# Patient Record
Sex: Female | Born: 1950 | Race: White | Hispanic: No | Smoking: Never smoker
Health system: Southern US, Academic
[De-identification: ages and names within clinical notes are randomized; demographics above are authoritative.]

## PROBLEM LIST (undated history)

## (undated) NOTE — Progress Notes (Signed)
Formatting of this note is different from the original.  Images from the original note were not included.  May 19, 2014    RE: Jamie Chambers    DOB: 07/21/1951  [facility] MEDICAL RECORD [id number]  Referring MD:  Dr. Carolann Littler Shrader  [address]                  [place] [address]    HPI:  Jamie Chambers is a 75 y.o. female with a PMHx of HTN, GERD, OSA and fibromyalgia who was seen today in the Neurosurgery clinic at the [facility] with Dr. Bluford Main. Shalini Stinebaugh was referred by Dr. Micheline Chapman for evaluation of L-sided neck pain and low back. She states that she has had many years of intermittent and progressive neck pain without h/o specific injury. She feels the neck pain has significantly worsened over the last 2 years. She complains of L-sided neck pain that radiates up into the occiput, down the L side of the neck, across the trapezius and over to the L shoulder. She has no radicular pain past the shoulder. She denies numbness but does feel as though she has intermittent weakness, at least of the hands, because she feels she more frequently "drops things" with both hands. Her pain is minimal with sitting, but as soon as she begins moving her neck the pain increases and she notes crepitus. She reports dull, occipital and posterior L-sided headaches. Additionally she complains of low back pain which is worst in the am when she first gets out of bed. The pain radiates to the posterior R hip but not down the leg. She complains of "numbness" circumferentially down the L leg from mid-thigh to the foot. She denies any bowel/bladder symptoms. She has been using Motrin OTC on average of 4 pills/day and recently completed ~6 weeks of PT for her neck which she felt helped while she was doing it, but obtained no lasting relief. She has not tried injections or oral steroids. She had a cervical MRI done in June which demonstrated diffuse multilevel mild degenerative changes with no  significant malalignment with several small disc bulges that cause some flattening of the ventral cord, but no significant central or foraminal stenosis. Additionally she brought with her an MRI (on CD) of her lumbar spine which is > 55 year old, but demonstrated no significant central or foraminal stenosis.    PAST MEDICAL HISTORY:  Past Medical History   Diagnosis Date   ? Hypertension    ? Heartburn    ? Unspecified sleep apnea    ? Fibromyalgia      Past Surgical History   Procedure Laterality Date   ? Appendectomy     ? Cholecystectomy     ? Hysterectomy     ? Tonsillectomy       Current Outpatient Prescriptions   Medication Sig   ? amlodipine (NORVASC) 5 MG tablet Take 5 mg by mouth daily.   ? aspirin 325 MG EC tablet Take 325 mg by mouth daily.   ? docusate sodium (COLACE) 100 MG capsule Take 100 mg by mouth 2 times daily.   ? Esomeprazole Magnesium (NEXIUM) 10 MG packet Take 10 mg by mouth every morning (before breakfast).   ? losartan (COZAAR) 50 MG tablet Take 50 mg by mouth daily.   ? metoprolol (TOPROL-XL) 50 MG 24 hr tablet Take 50 mg by mouth daily.     No current facility-administered medications for  this visit.     Allergies   Allergen Reactions   ? Ace Inhibitors Swelling     Family History   Problem Relation Age of Onset   ? Cancer Father    ? Heart Disease Father      History     Social History   ? Marital Status: Married     Spouse Name: N/A     Number of Children: N/A   ? Years of Education: N/A     Occupational History   ? Not on file.     Social History Main Topics   ? Smoking status: Never Smoker    ? Smokeless tobacco: Not on file   ? Alcohol Use: No   ? Drug Use: No   ? Sexual Activity: Not on file     Other Topics Concern   ? Not on file     Social History Narrative     REVIEW OF SYSTEMS:  No chest pain, SOB.  Denies headache, nausea,vomiting, fever, chills, vision changes, dizziness, balance problems, seizure. All other systems are negative except as noted in the HPI.    PHYSICAL EXAM:      Ht 1.524 m (5')  Wt 70.308 kg (155 lb)  BMI 30.27 kg/m2    Ms. [name] is a well developed, well nourished female in no acute distress. She is alert and cooperative. Her cervical spine is somewhat tender to palpation from the insertion of the L trapezius at the occiput, along the L side of the neck and across the top of the L shoulder. The lower back is slightly tender to palpation at the L SI joint. There is no evidence of paraspinous muscle spasm. Cervical spine ROM is slightly decreased in forward flexion and L-sided rotation with reported pain. The lumbar spine ROM is slightly decreased in all directions, most significantly in extension but not with rotation. ROM at the bilateral hips is full without significant pain. Her extremities are nontender and without clubbing, cyanosis, or edema. Muscle bulk and tone are normal. Straight leg raising is negative in the seated position. Her upper and lower extremity strength testing is 5/5 for deltoids, triceps, biceps, wrist flexion/extension, and hand intrinsics, as well as hip flexion, adduction and abduction and knee flexion, knee extension, foot dorsiflexion, foot plantar flexion, and EHL. Sensation is intact to light touch throughout. Deep tendon reflexes are normoactive and symmetric. There is no evidence of pathologic reflexes. Her gait is normal. She is able to tandem walk and to walk on her heels and toes with minimal difficulty.    IMAGING:  The following imaging studies were personally reviewed by myself and Dr. Tomma Lightning during today's clinic visit:  MRI of cervical spine from [facility] in [place], [place] (brought on CD, but not in [facility] PACS) dated 02/25/2014 demonstrates satisfactory alignment with small disc bulges, most prominent at C3-4, C5-6 and C6-7 but without significant cord compression or L-sided foraminal stenosis at any level. At C5-6 and C6-7 there is some uncovertebral hypertrophy causing mild to moderate foraminal stenosis on the RIGHT  (asymptomatic side). There is no evidence of cord signal change at any level.  C5-6  MRI of the lumbar spine from [facility] (on CD, but not in [facility] PACS) dated 12/19/2012 demonstrates satisfactory alignment without evidence of significant vertebral body or disc disease and no central or foraminal stenosis at any level.    ASSESSMENT:  Ms. Hackett is a pleasant 11 y.o. female with a long history of intermittent and progressive neck pain which has significantly worsened over the last 2 years without any history of injury. The pain seems myofascial ant isolated to the L trapezius. Additionally, she complains of low back pain without radicular pain, numbness or weakness. Her exam, likewise is more consistent with myofascial pain in the L trapezius and she has no focal neurologic deficit. Cervical and lumbar MRI's demonstrate no significant cervical or lumbar central or foraminal stenosis to explain her current symptoms. Although she has recently completed a trial of PT for the neck but has not really had any other significant medical management. There is no indication for surgical intervention at this time.    PLAN:  The patient's history was discussed with and she was seen by Dr. Tomma Lightning and all available images were reviewed. We have recommended continued medical management and have suggested starting with stronger and more consistent use of NSAIDs and provided a prescription for Mobic 7.5 mg, disp. #90 with no refills per the patients request for a 3 month supply. Additionally, we would consider a trial of oral steroid and perhaps an evaluation to a Rheumatologist or PM&R for recommendations for long term medical management. She has expressed an interest in obtaining treatment closer to home and we recommended she discuss additional treatments and referrals with her PCP. We have not scheduled  her back for Neurosurgical follow up at this time as there is currently no indication for surgical intervention. She knows to call or return to clinic with any questions or concerns.    Nathanial Millman, PA-C  Physician Assistant to Dr. Bluford Main  [facility], [place] [address]  Office [phone]  Fax [phone]    I personally saw the patient, reviewed the laboratory and imaging data and performed an examination.  I have reviewed and/or edited the provider's note above and agree with the assessment and plan.    Idalia Needle Tomma Lightning, M.D.  Department of Neurological Surgery  [place] [address]  Tel: [phone]  Fax: [phone]      Electronically signed by Bluford Main, MD at 06/19/2014 12:59 PM EDT

## (undated) NOTE — Progress Notes (Signed)
Formatting of this note might be different from the original.  Sept. 21, 2015    Dear Dr. Micheline Chapman:    We saw Jamie Chambers in the neurosurgery clinic at [facility].  She comes in with 2 complaints, left-sided neck pain and low back pain.  She says that she has had neck pain for many years.  It has been intermittent but generally progressive over time.  There was no specific precipitating event. This pain extends from the suboccipital region out along the left trapezius muscle into the shoulder.  It does not extend distally into the  Arm.  The pain is exacerbated by head neck movements and activity in general.  Her low back pain originates in the midline and radiates to the right hip.  It extends to the region of the sacroiliac joint but does not radiate into the leg.  She does however describe numbness in the left lower extremity in a non dermatomal distribution.  Her low back pain is worse in the morning and is improved somewhat by activity.  Later in the day however the pain recurs.  This patient has tried over-the-counter ibuprofen  and recently completed a course of physical therapy.   She does have a history of fibromyalgia.    On my examination today she is fully alert and cooperative.  She is normocephalic.  Pupils are equal round reactive to light.  Facial movement and sensation is normal.  She exhibits some decreased cervical range of motion particularly in flexion.  This is in part due to pain and muscle tightness.  She has several tender points both in the suboccipital region as well as over the trapezius.  She is nontender in the interscapular muscles or on the right side.   Shoulder range of motion is full.  She does have tenderness in the lower lumbar region involving the paraspinous muscles  and the posterior superior iliac spine.  Lumbar range of motion is also decreased due to muscle tightness.  Straight-leg raising is negative in the seated position hip rotation is nonpainful.   Motor testing reveals 5 over 5 strength proximally and distally in all 4 limbs; sensory testing  Is grossly normal.   Deep tendon reflexes are normoactive and  Symmetrical.  There are no pathological reflexes.  Her gait is normal and she can walk on her toes and heels.    We reviewed MRI scans of the cervical and lumbar spine.  Cervical images reveal a normal lordosis, mild age-appropriate degenerative changes and mild annular bulges perhaps most notable at C5-6 and C6-7.  There is no cord compression nor significant foraminal stenosis.  There is some mild foraminal stenosis on the right but this does not correlate clinically.  There is no cord signal change on T2 weighted images.    The lumbar MRI demonstrates a normal lordosis with very mild degenerative change.  There is no significant root impingement seen and the alignment is normal.    To summarize, this is a pleasant 51 year old woman with axial neck pain and pain involving the left trapezius.  This seems to be a myofascial type of pain which would be best managed medically.  There are no structural issues revealed on the MRI other than mild expected degenerative changes.  The same could be said regarding the lumbar region.  Again medical management is recommended.  Today we prescribed Mobic which might provide a more consistent way to manage her with nonsteroidal anti-inflammatory medications.  We did suggest that  she discuss with you the possibility of seeing a rheumatologist or perhaps a physical medicine and rehabilitation physician.      As there is no clear surgical indication at this time I have not scheduled her for a followup appointment.  She does know to call us if she has any questions or concerns.  We also encourage you to contact us if you have any concerns.  We appreciate the opportunity to be involved in this nice lady's care.      Electronically signed by Baltazar Najjar at 06/19/2014 12:59 PM EDT

---

## 1991-02-06 ENCOUNTER — Inpatient Hospital Stay (HOSPITAL_COMMUNITY): Payer: Self-pay

## 2017-08-01 DIAGNOSIS — M48061 Spinal stenosis, lumbar region without neurogenic claudication: Secondary | ICD-10-CM | POA: Insufficient documentation

## 2020-12-09 IMAGING — CT CT SINUS W/O CONTRAST
3 series · 16 of 47 positions shown, 19 images · non-contrast
Comparison: None available.

﻿EXAM:  [id number]   CT SINUS W/O CONTRAST
INDICATION: Headache, sinusitis.
TECHNIQUE: Axial noncontrast CT imaging of the paranasal sinuses was performed. Images were reviewed in multiple windows and projections. Exam was performed using 1 or more of the following dose reduction techniques: Automated exposure control, adjustment of the mA and/or kV according to patient size, or the use of iterative reconstruction technique.

[ax · axial · 0.27mm/px · z∈[-429,-337]mm · 10 of 54 slices shown, 13 images]
[im 4/54  brain]
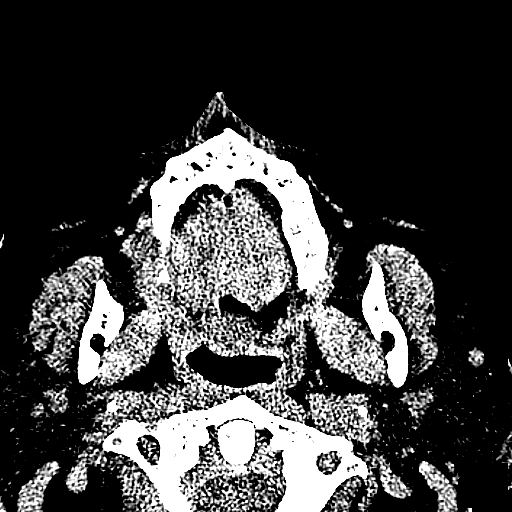
[im 4/54  bone]
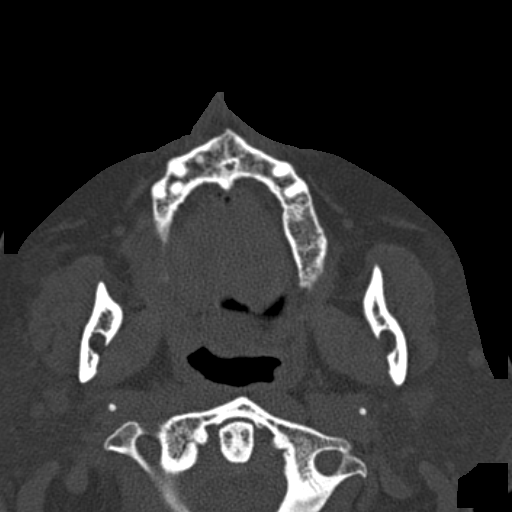
[im 10/54  bone]
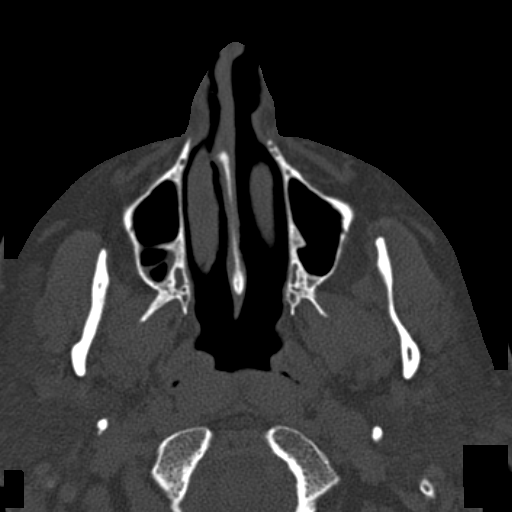
[im 15/54  bone]
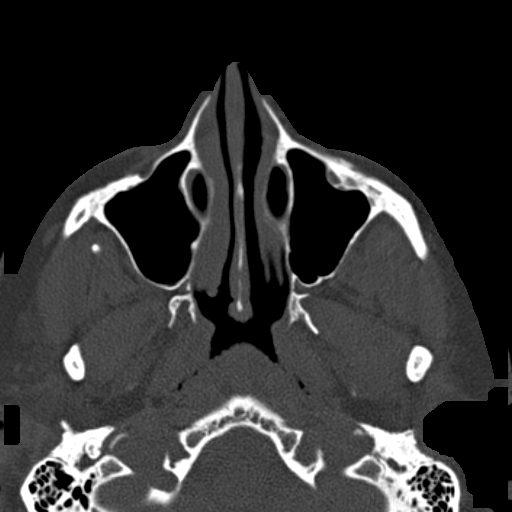
[im 19/54  bone]
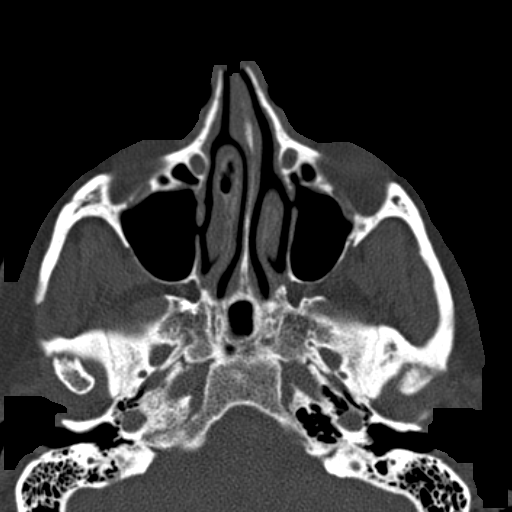
[im 24/54  brain]
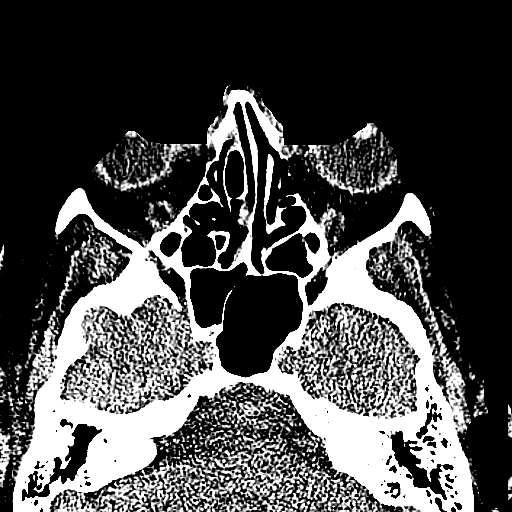
[im 24/54  bone]
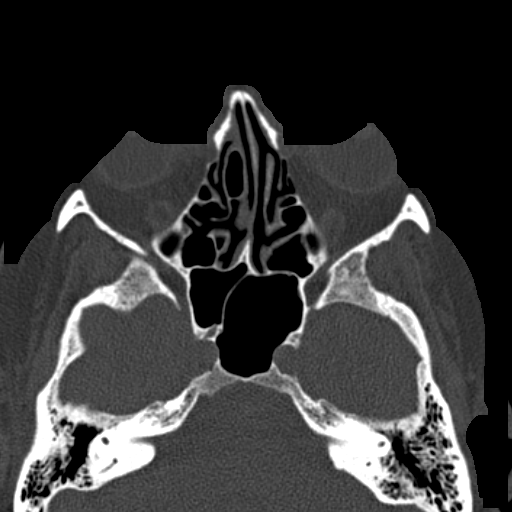
[im 30/54  bone]
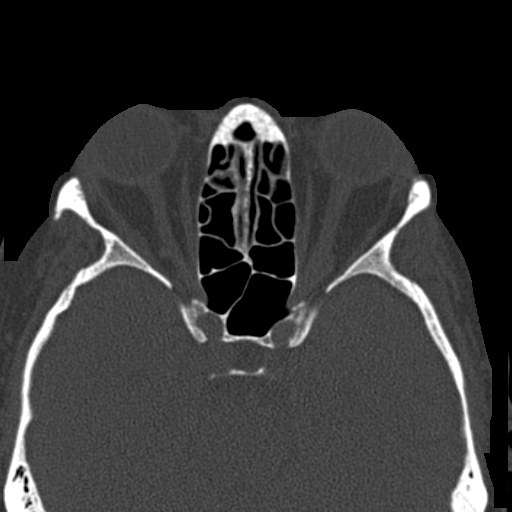
[im 35/54  bone]
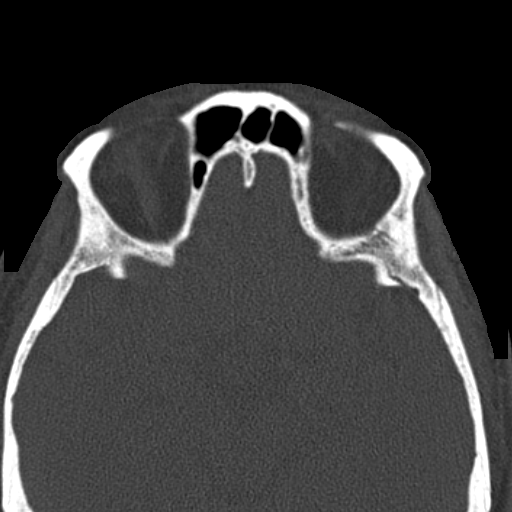
[im 41/54  bone]
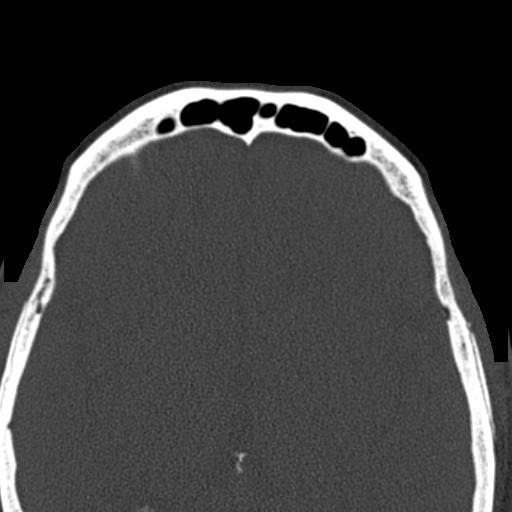
[im 44/54  brain]
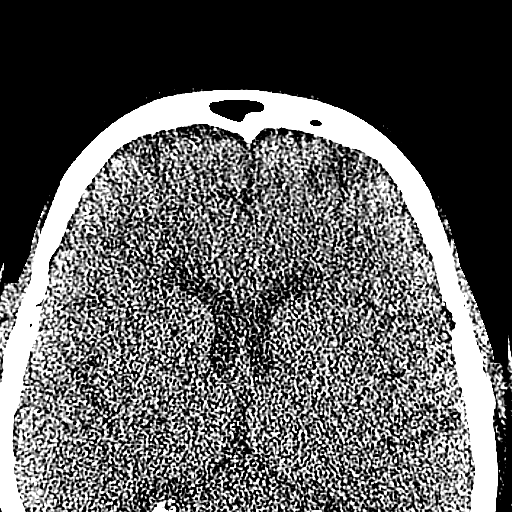
[im 44/54  bone]
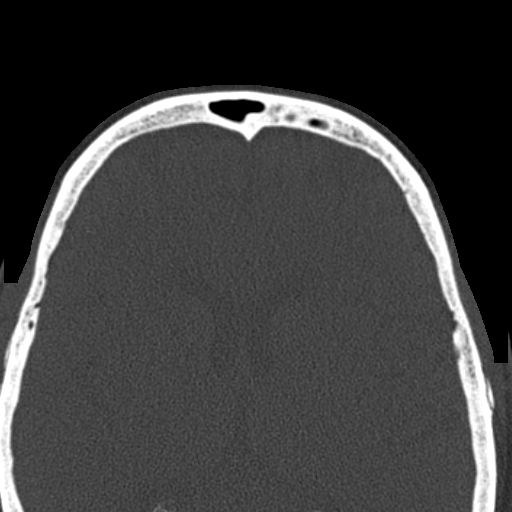
[im 50/54  bone]
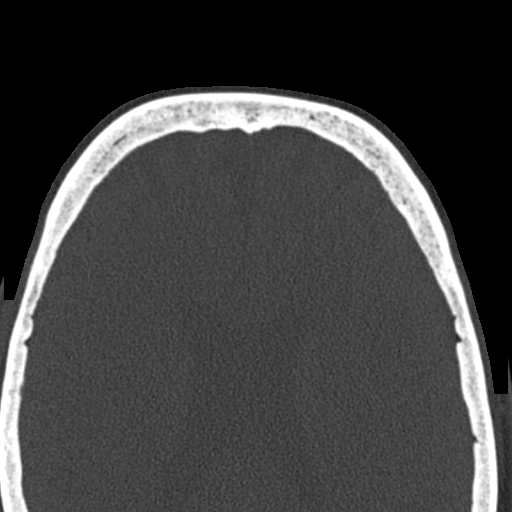

[cor · coronal · 0.23mm/px · 3 of 47 slices shown]
[im 16/47  bone]
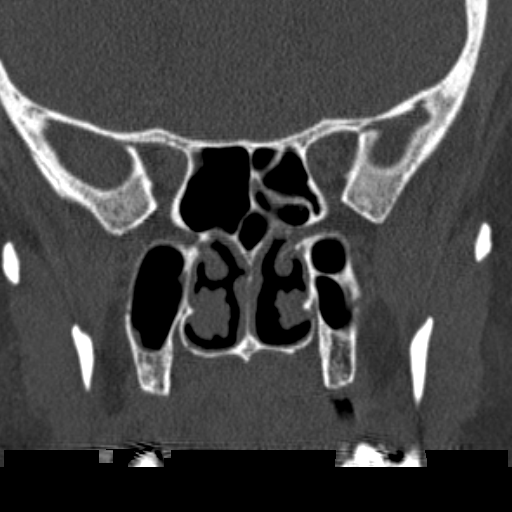
[im 21/47  bone]
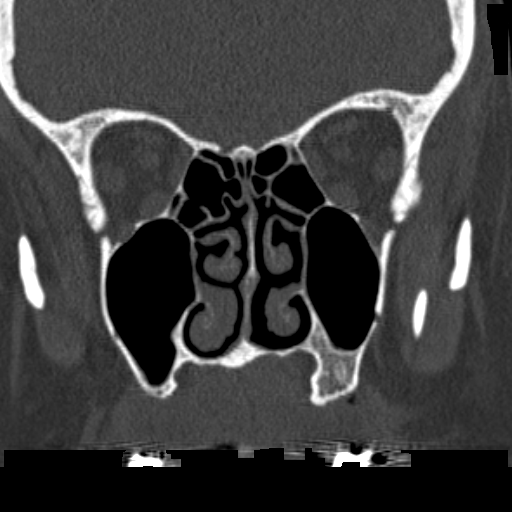
[im 26/47  bone]
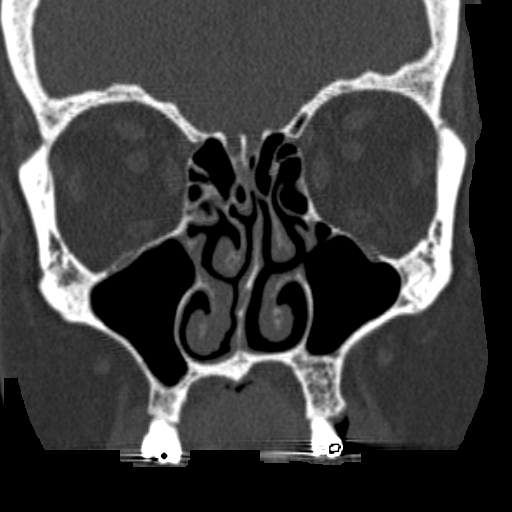

[sag · sagittal · 0.22mm/px · 3 of 59 slices shown]
[im 20/59  bone]
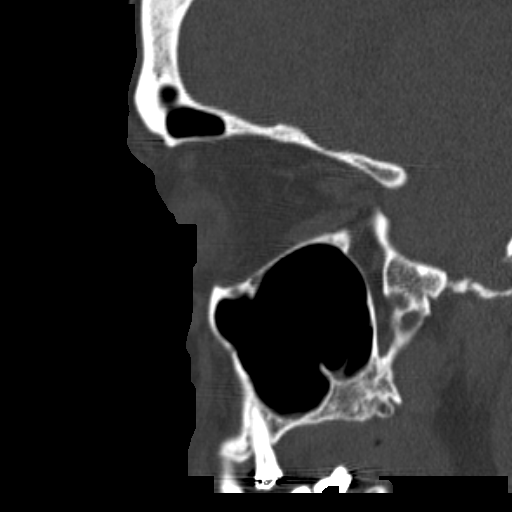
[im 30/59  bone]
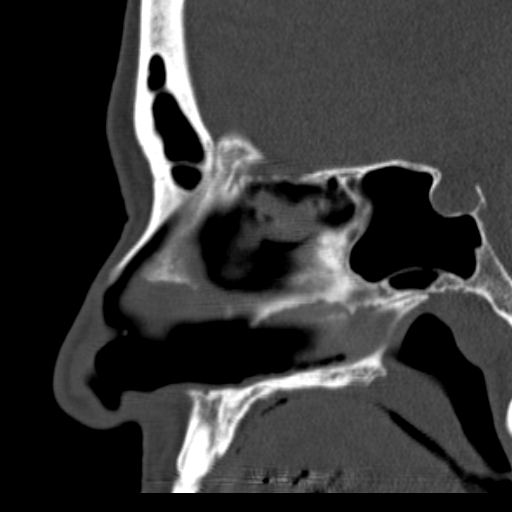
[im 39/59  bone]
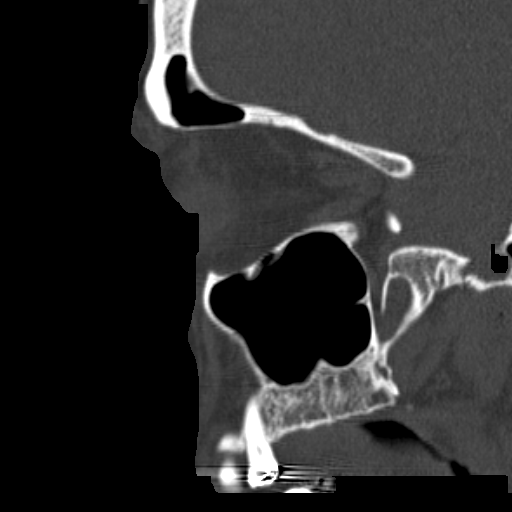

[16 of 47 positions shown; findings below may reference images not displayed]

FINDINGS: Frontal, ethmoid, maxillary and sphenoid sinuses are well aerated without significant opacification or mucoperiosteal thickening. Ostiomeatal units are also patent bilaterally. Right-sided concha bullosa is incidentally identified. There is no significant deviation of the nasal septum. Orbital contents are also unremarkable.
IMPRESSION: Unremarkable exam.

## 2020-12-24 IMAGING — MR MRI LUMBAR SPINE WITHOUT CONTRAST
4 of 6 series · 31 of 48 positions shown · IV contrast (gadolinium)
Comparison: None available.

﻿EXAM:  [id number]   MRI LUMBAR SPINE WITHOUT CONTRAST
INDICATION: Lower back pain with bilateral lower extremity radiculopathy.
TECHNIQUE: Multiplanar multisequential MRI of the lumbar spine was performed without gadolinium contrast.

[Series 5: T2 · sagittal · 4.0mm · 0.94mm/px · 6 of 13 slices shown (1 of 3)]
[im 1/13]
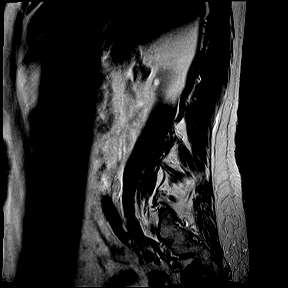
[im 3/13]
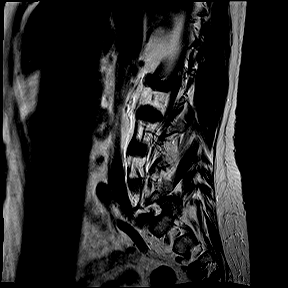
[im 5/13]
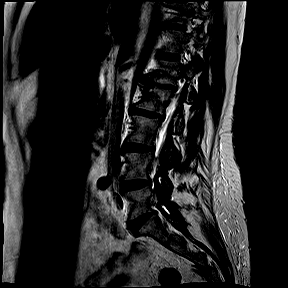
[im 8/13]
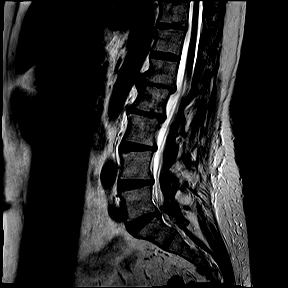
[im 10/13]
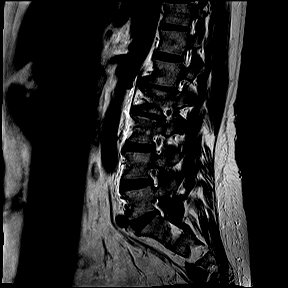
[im 13/13]
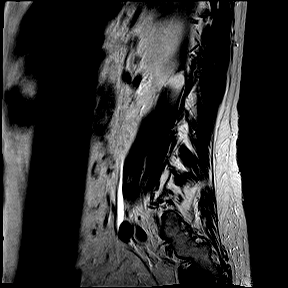

[Series 6: T1 · sagittal · 4.0mm · 0.94mm/px · 6 of 13 slices shown]
[im 1/13]
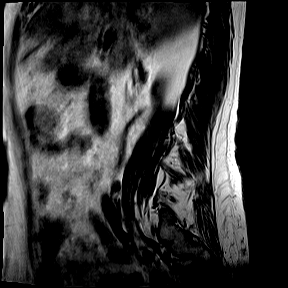
[im 3/13]
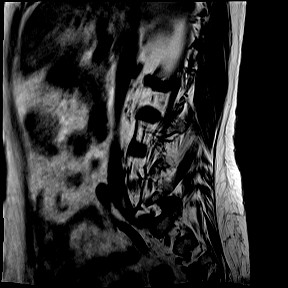
[im 5/13]
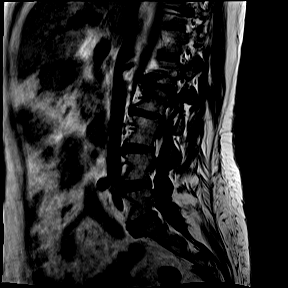
[im 8/13]
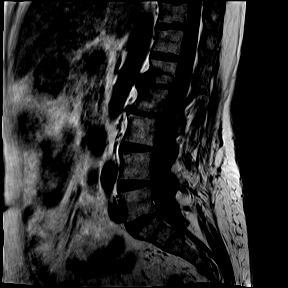
[im 10/13]
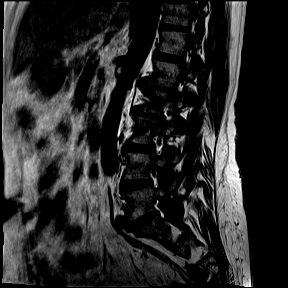
[im 13/13]
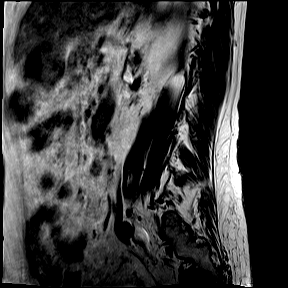

[Series 8: T2 · coronal · 5.0mm · 0.82mm/px · 8 of 18 slices shown (2 of 3)]
[im 1/18]
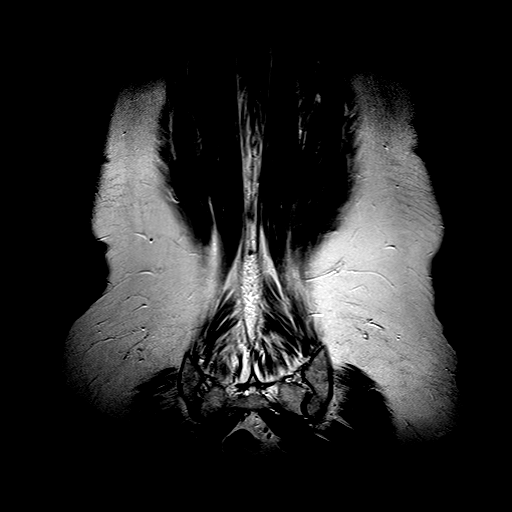
[im 3/18]
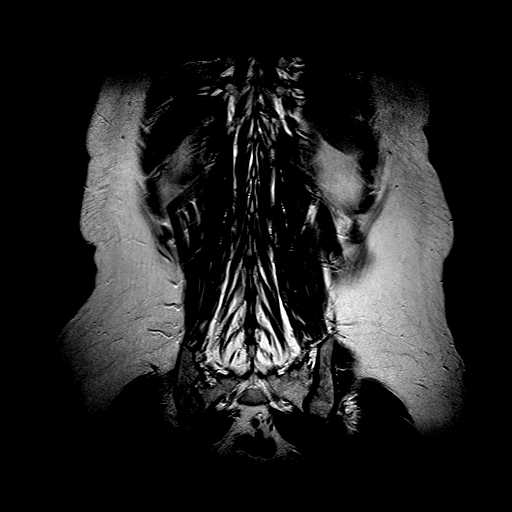
[im 5/18]
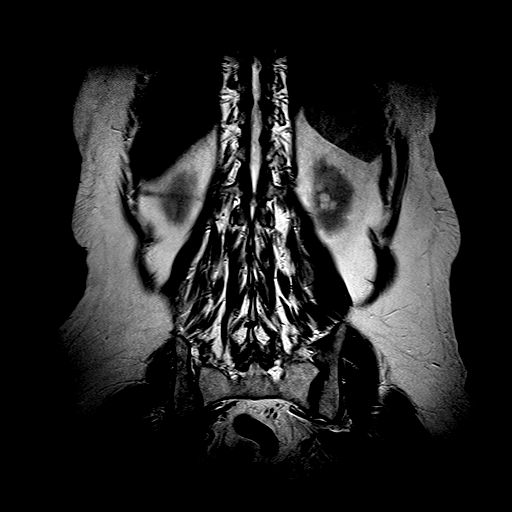
[im 8/18]
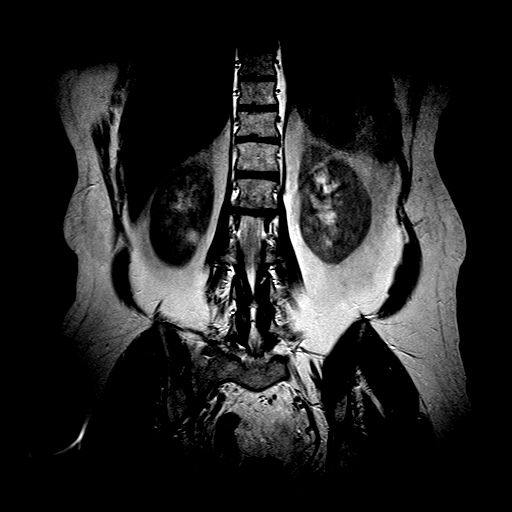
[im 10/18]
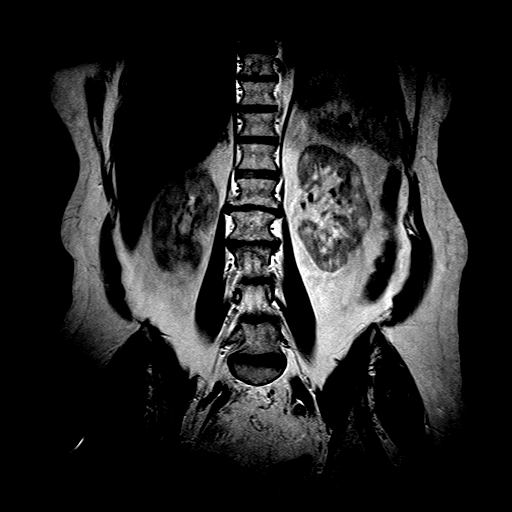
[im 13/18]
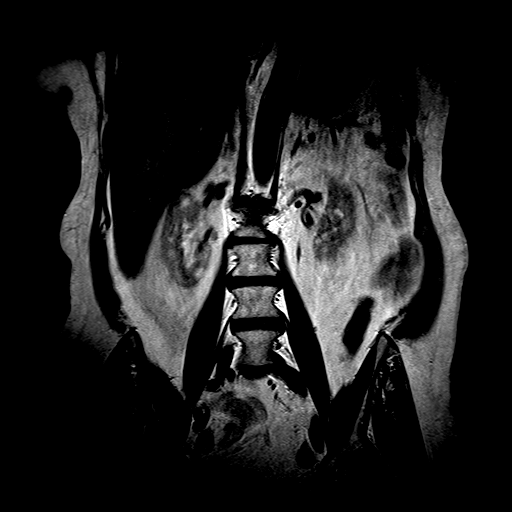
[im 15/18]
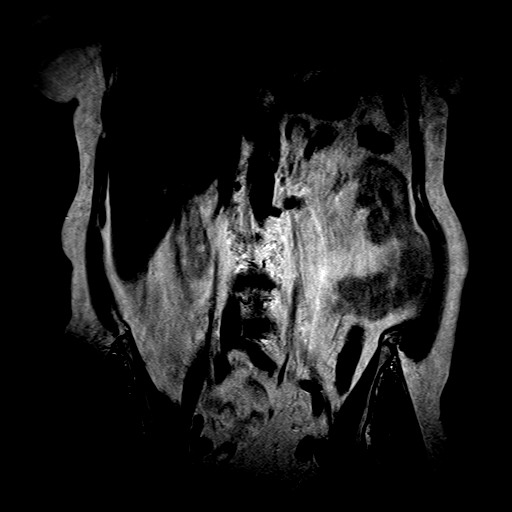
[im 18/18]
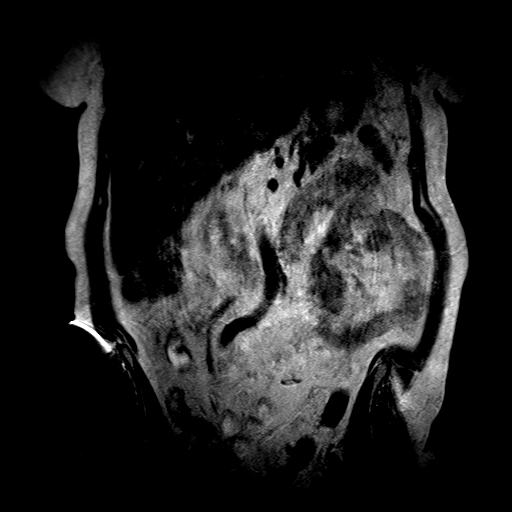

[Series 9: T2 · axial · 4.0mm · 0.52mm/px · z∈[-149,+99]mm · 11 of 26 slices shown (3 of 3)]
[im 1/26]
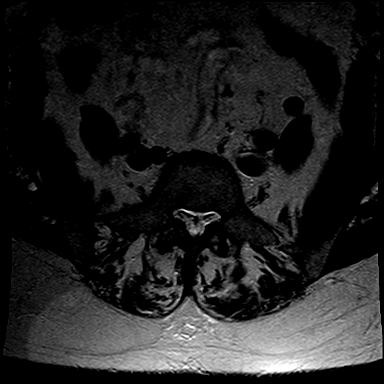
[im 3/26]
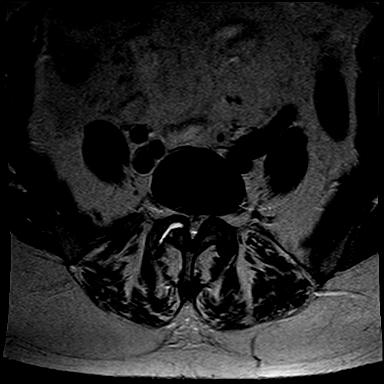
[im 6/26]
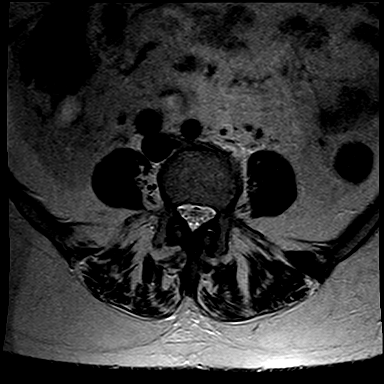
[im 8/26]
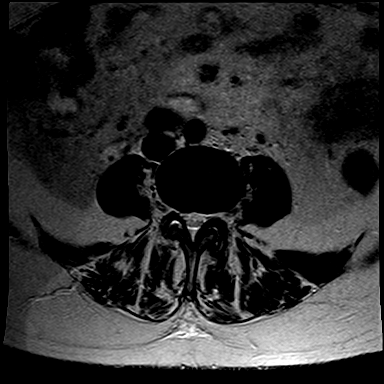
[im 11/26]
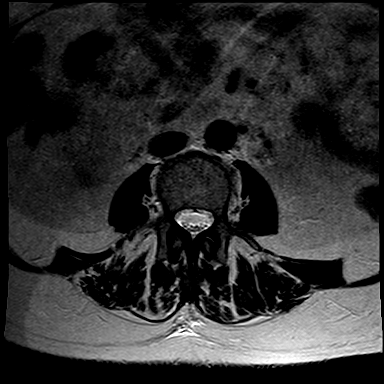
[im 13/26]
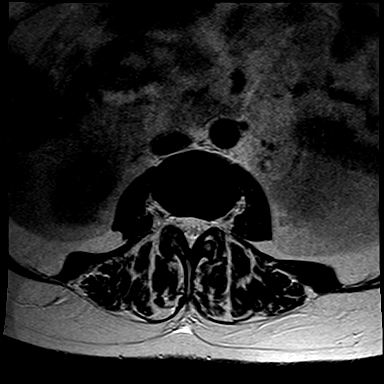
[im 16/26]
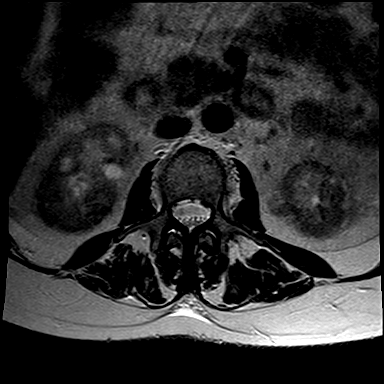
[im 18/26]
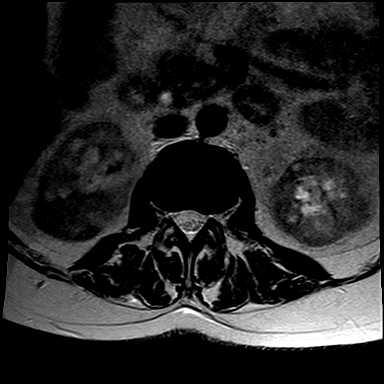
[im 21/26]
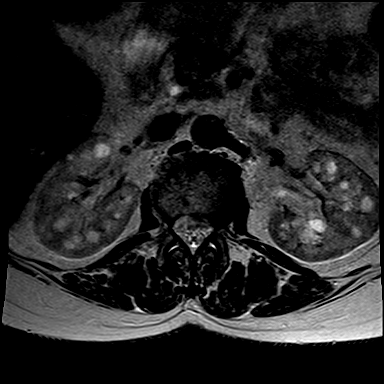
[im 23/26]
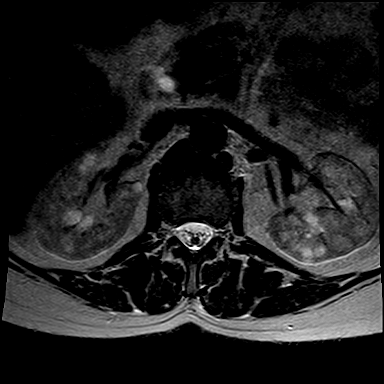
[im 26/26]
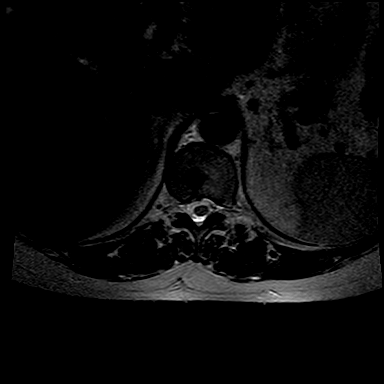

[31 of 48 positions shown; findings below may reference images not displayed]

FINDINGS: Bone marrow signal intensity is normal. There is no acute fracture or subluxation. Distal spinal cord is normal in signal intensity and terminates normally at L1 vertebral body level. Spinal canal is congenitally narrow.

At L1-2 level, there is moderate to marked disc desiccation. There is also minimal retrolisthesis of L1 on L2 vertebral body. There is a small broad-based central disc bulge, mildly effacing the ventral thecal sac. Modic type 2 changes are also noted within the inferior endplate of L1 and superior endplate of L2 vertebral body. There is mild bilateral neural foraminal stenosis from bulging annulus without nerve root impingement.

At L2-3 level, there is moderate to marked disc desiccation. Minimal retrolisthesis of L2 on L3 vertebral body is also identified. There is a small broad-based central disc bulge, mildly effacing the ventral thecal sac. There is mild bilateral neural foraminal stenosis from bulging annulus without nerve root impingement.

At L3-4 level, there is a small broad-based central disc bulge, mildly effacing the ventral thecal sac. There is mild left neural foraminal stenosis from bulging annulus without nerve root impingement.

At L4-5 level, there is a small broad-based central disc bulge, mildly effacing the ventral thecal sac. There is mild bilateral neural foraminal stenosis from facet arthropathy and bulging annulus.

At L5-S1 level, there is a small broad-based central disc bulge with associated ligamentous hypertrophy resulting in moderate to severe spinal stenosis. There is mild left and moderate right neural foraminal stenosis from facet arthropathy and bulging annulus.

Paraspinal soft tissues are unremarkable. There are multiple small bilateral renal cysts.
IMPRESSION: 1. Minimal retrolisthesis of L1 on L2 and L2 on L3 vertebral body. 

2. Moderate to severe spinal stenosis at L5-S1 level from a small central disc bulge and associated ligamentous hypertrophy. 

3. Multilevel neural foraminal stenosis as detailed above.

## 2021-01-28 DIAGNOSIS — M19071 Primary osteoarthritis, right ankle and foot: Secondary | ICD-10-CM | POA: Insufficient documentation

## 2022-07-26 IMAGING — DX XRAY LUMBAR SPINE MINIMUM 4 VIEWS
1 series · 4 of 4 positions shown · non-contrast
Comparison: None available.

﻿EXAM:  [id number]      XRAY LUMBAR SPINE MINIMUM 4 VIEWS
INDICATION: Low back pain.

[Series 1: AP · 0.14mm/px · 4 of 4 slices shown]
[im 1/4]
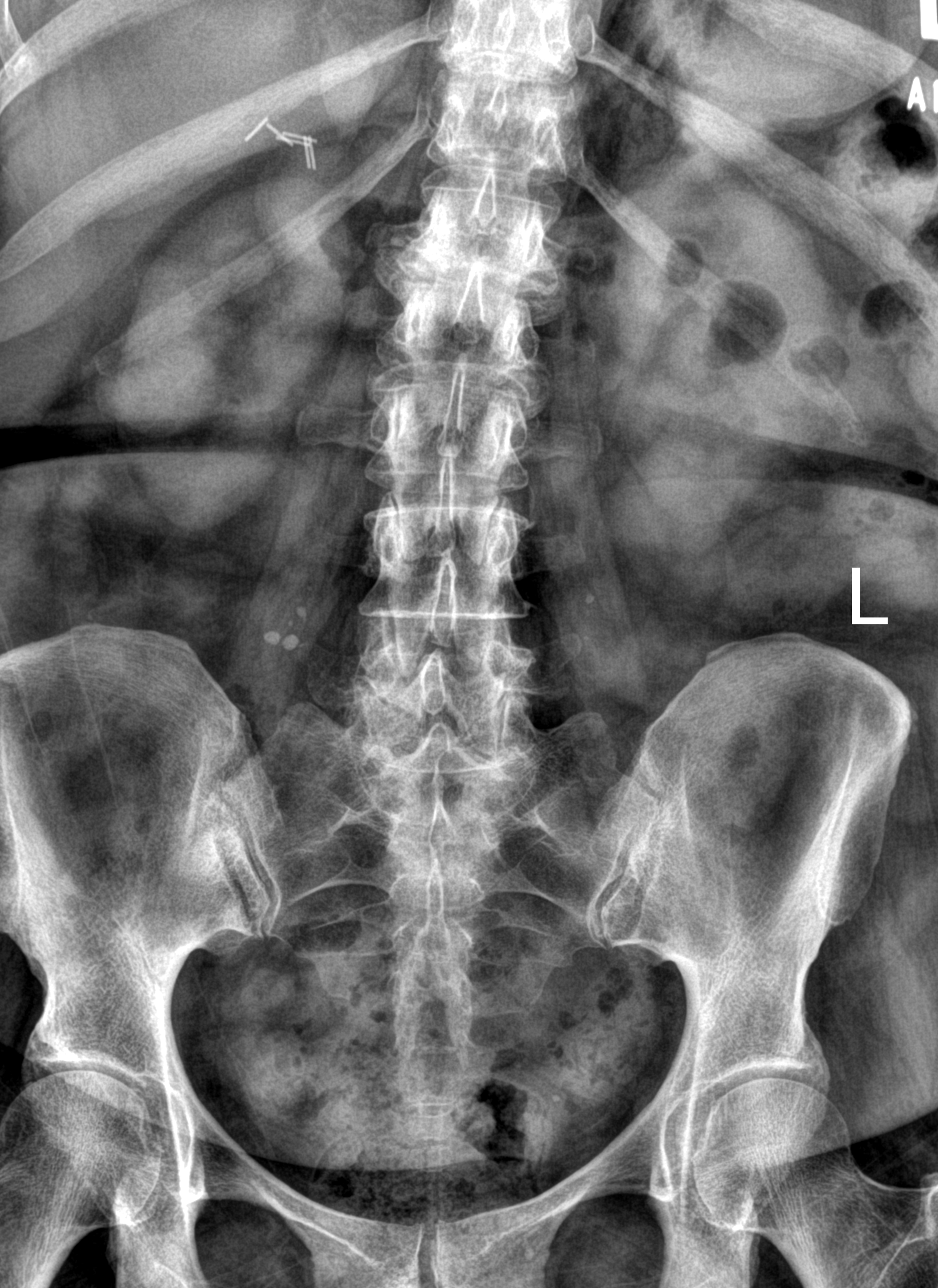
[im 2/4]
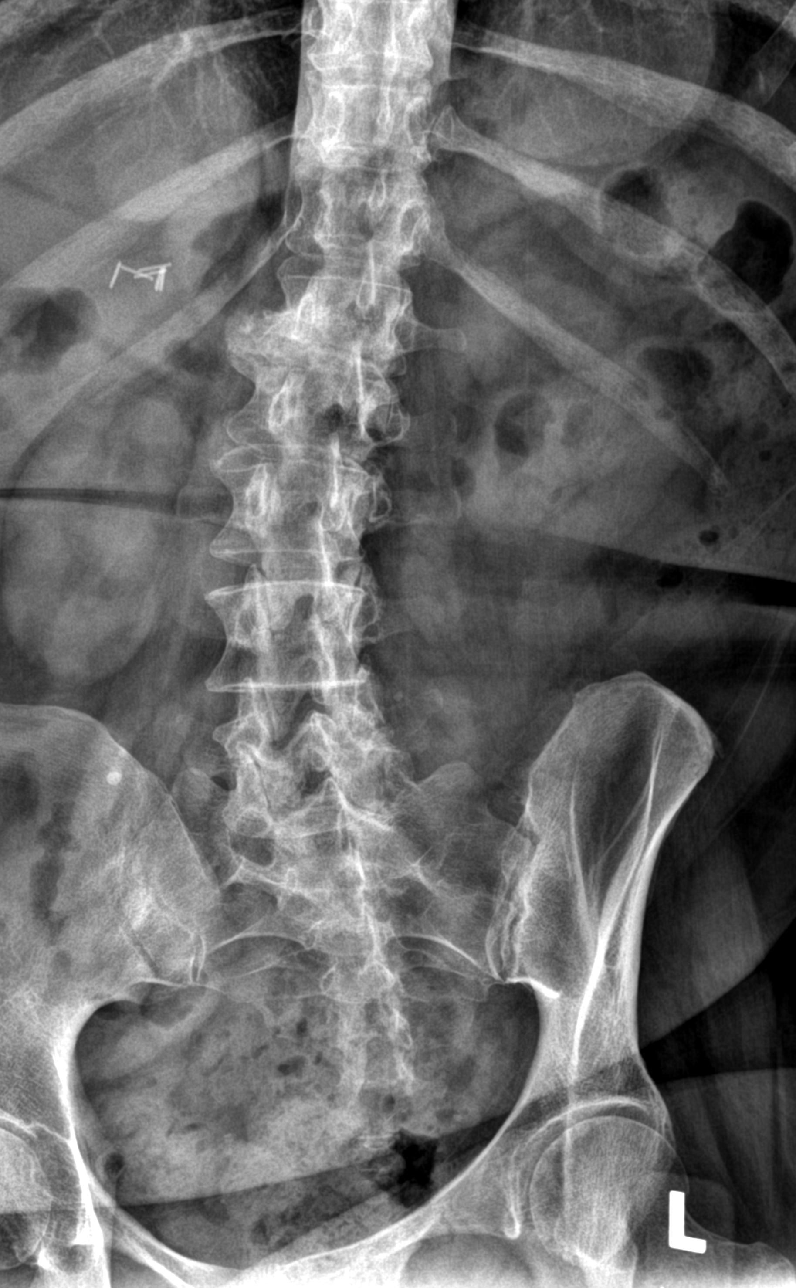
[im 3/4]
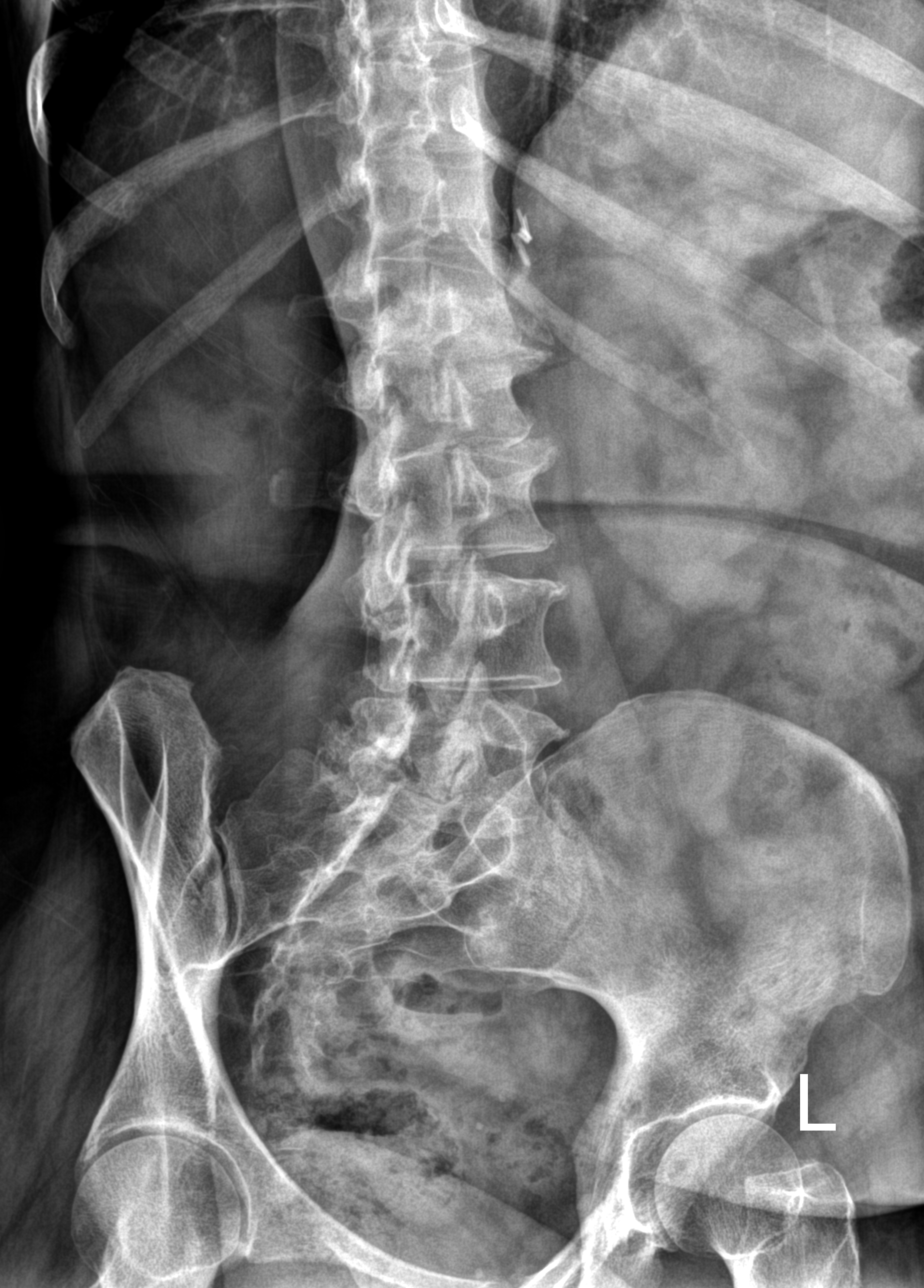
[im 4/4]
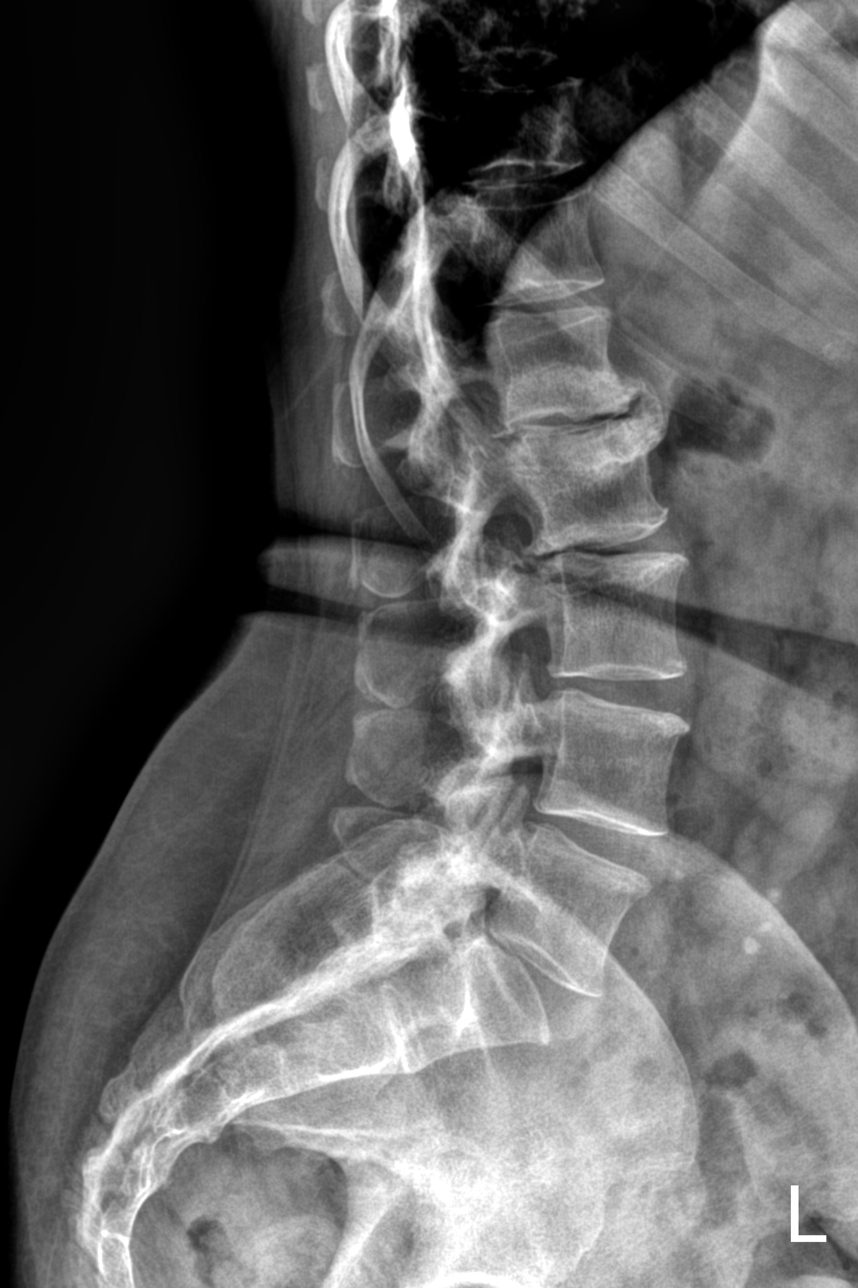

[4 of 4 positions shown; findings below may reference images not displayed]

FINDINGS: Four views demonstrate moderately severe degenerative changes predominantly at L1-L2 and L2-L3 with disc space narrowing, endplate sclerosis, osteophyte formation, and vacuum disc phenomena.  

There is mild dextroconvex scoliosis.  No fracture or malalignment is seen.
IMPRESSION: Moderately severe degenerative changes.

## 2022-07-26 IMAGING — MR MRI LUMBAR SPINE WITHOUT CONTRAST
5 of 6 series · 33 of 48 positions shown · non-contrast
Comparison: None available.

﻿EXAM:  [id number]   MRI LUMBAR SPINE WITHOUT CONTRAST
INDICATION: Chronic low back pain with bilateral leg pain and weakness.
TECHNIQUE: Noncontrast multiplanar, multisequence MRI was performed.

[Series 5: T2 · sagittal · 4.0mm · 0.83mm/px · 6 of 13 slices shown (1 of 3)]
[im 1/13]
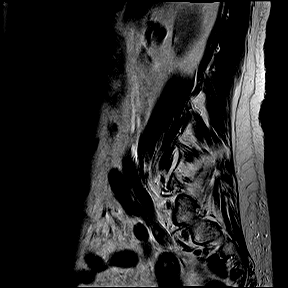
[im 3/13]
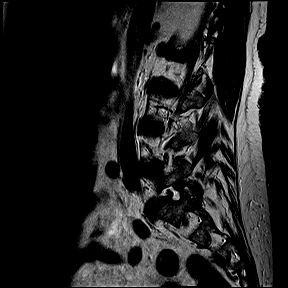
[im 5/13]
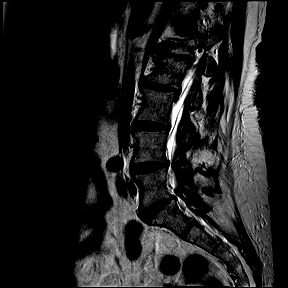
[im 8/13]
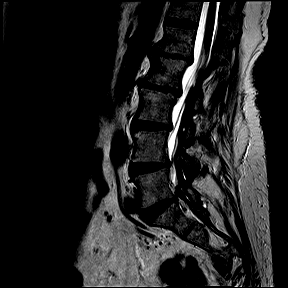
[im 10/13]
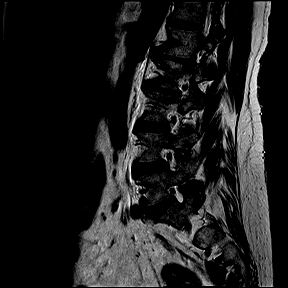
[im 13/13]
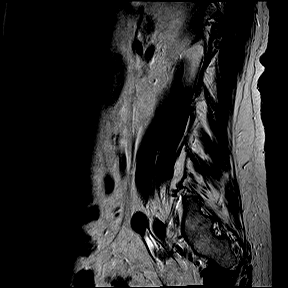

[Series 6: T1 · sagittal · 4.0mm · 0.83mm/px · 6 of 13 slices shown (1 of 2)]
[im 1/13]
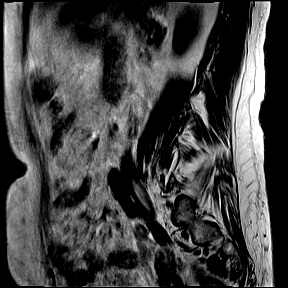
[im 3/13]
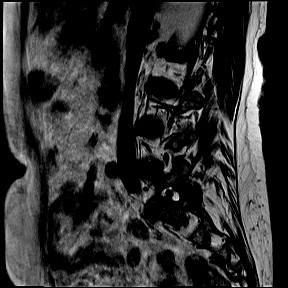
[im 5/13]
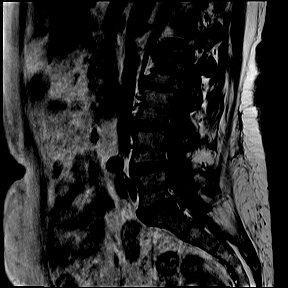
[im 8/13]
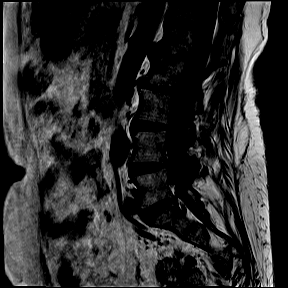
[im 10/13]
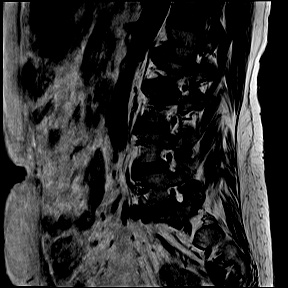
[im 13/13]
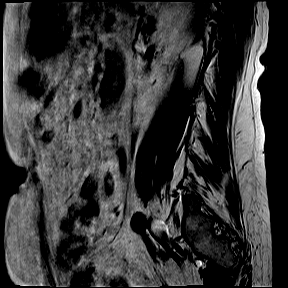

[Series 8: T2 · coronal · 5.0mm · 0.82mm/px · 8 of 18 slices shown (2 of 3)]
[im 1/18]
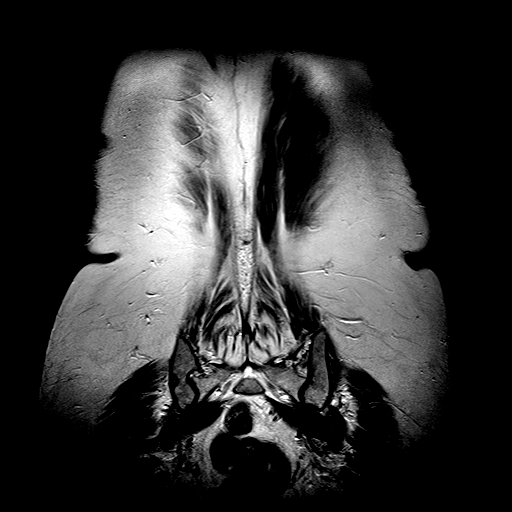
[im 3/18]
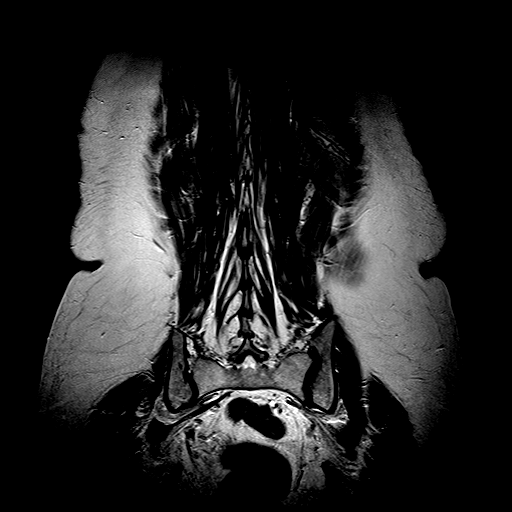
[im 5/18]
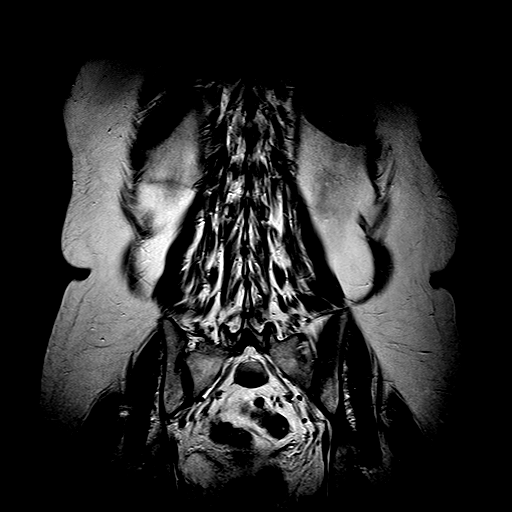
[im 8/18]
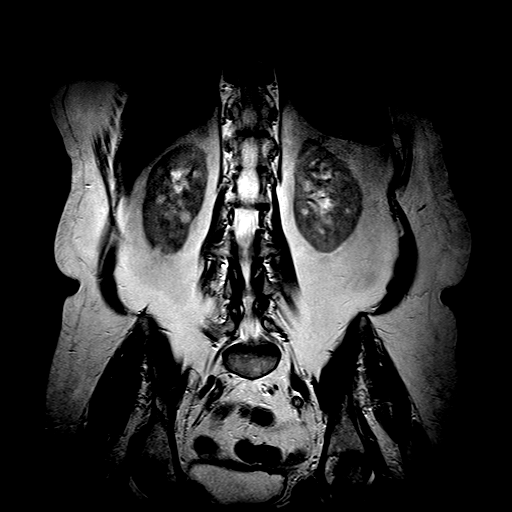
[im 10/18]
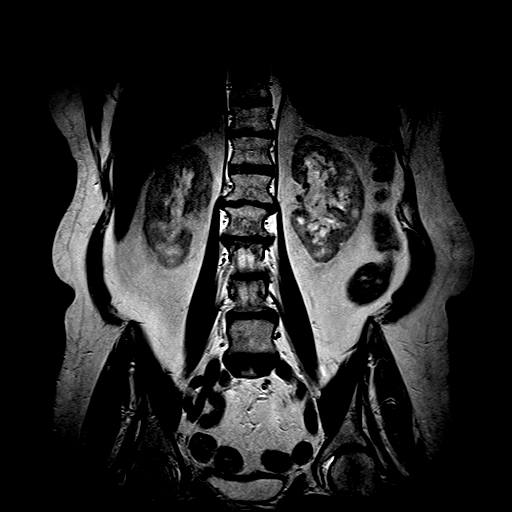
[im 13/18]
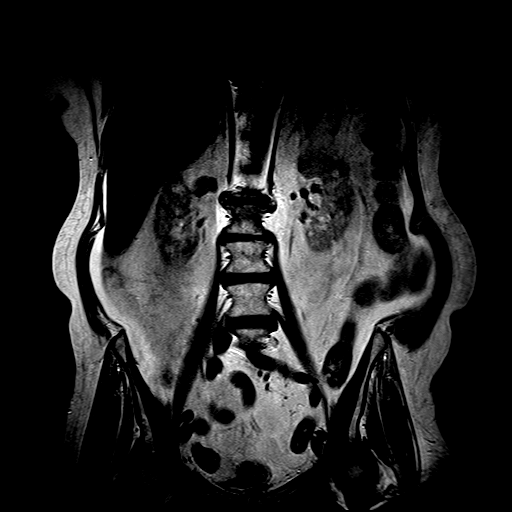
[im 15/18]
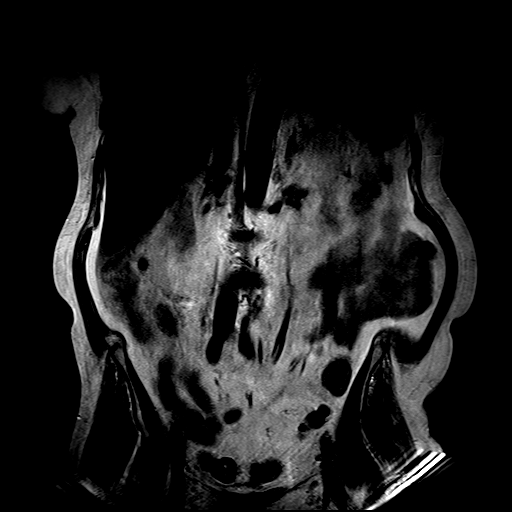
[im 18/18]
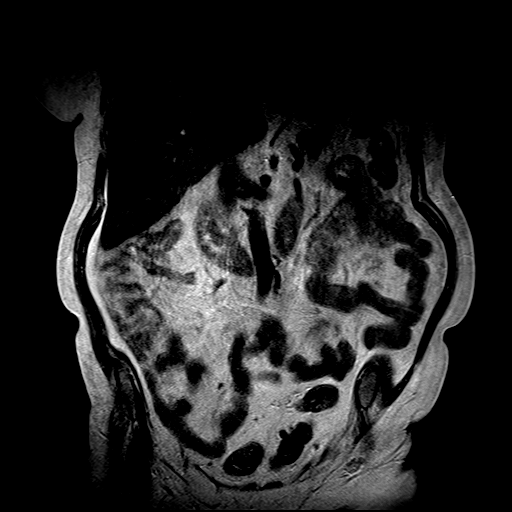

[Series 9: T2 · axial · 4.0mm · 0.52mm/px · z∈[-106,+98]mm · 11 of 23 slices shown (3 of 3)]
[im 1/23]
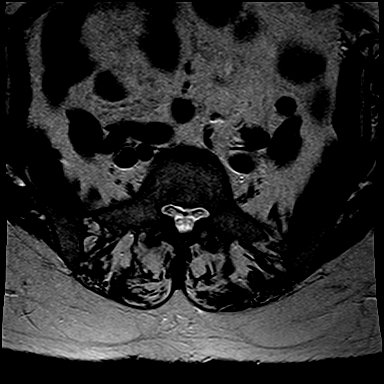
[im 3/23]
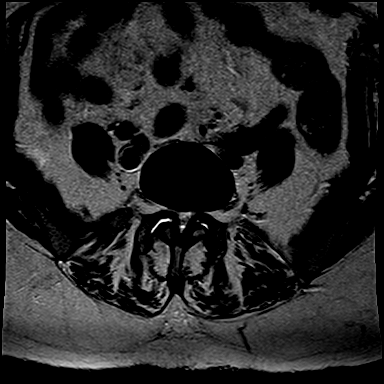
[im 5/23]
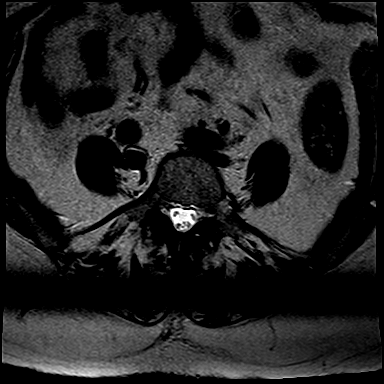
[im 7/23]
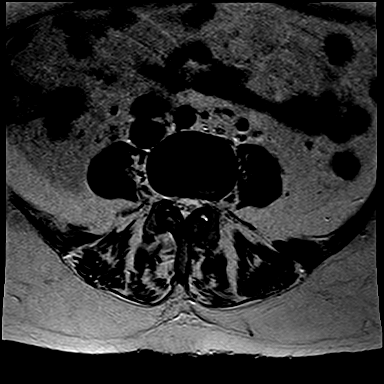
[im 9/23]
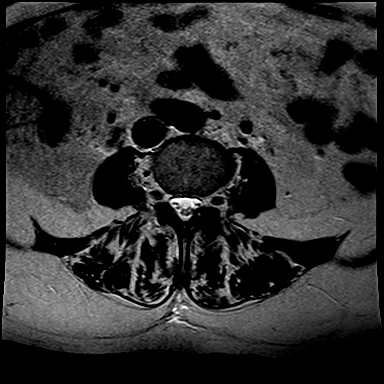
[im 12/23]
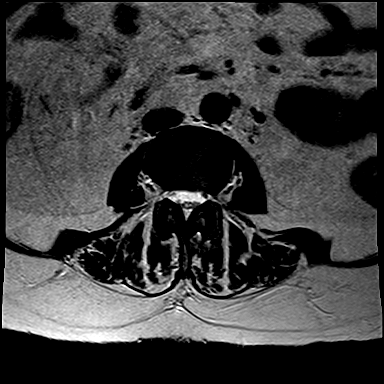
[im 14/23]
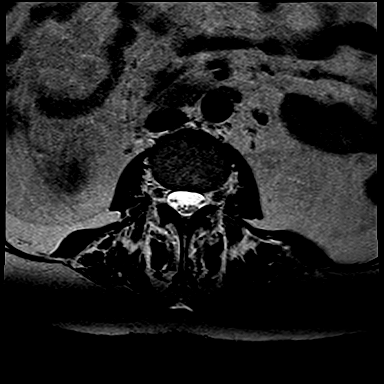
[im 16/23]
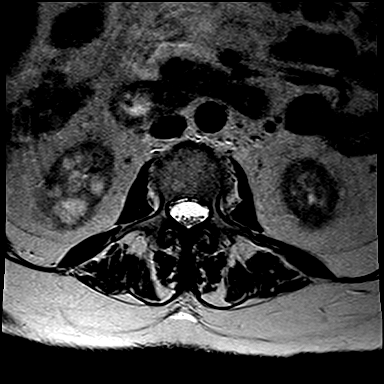
[im 18/23]
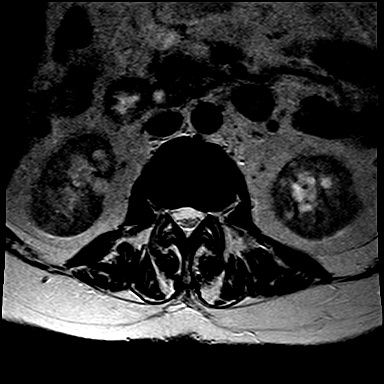
[im 20/23]
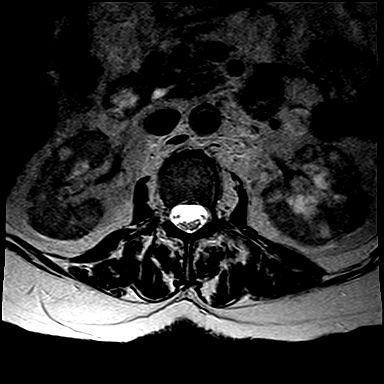
[im 23/23]
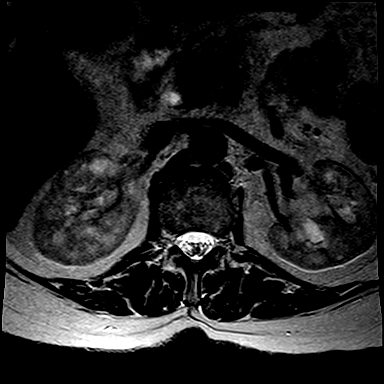

[Series 10: T1 · axial · 4.0mm · 0.52mm/px · z∈[-106,-88]mm · 2 of 23 slices shown (2 of 2)]
[im 1/23]
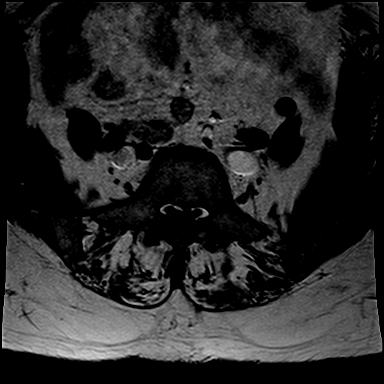
[im 5/23]
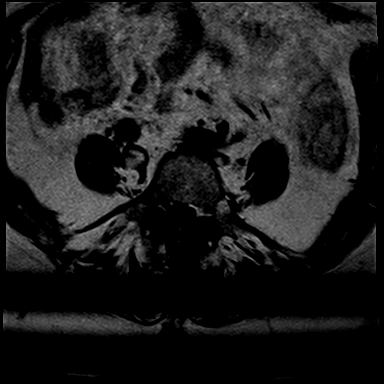

[33 of 48 positions shown; findings below may reference images not displayed]

FINDINGS: There are mild to moderate degenerative changes at multiple levels.  There is no fracture or significant malalignment.  Degenerative marrow signal alteration is noted.   The conus appears unremarkable.  

At L1-L2, there is mild disc bulging.  

L2-L3 and L3-L4 appear unremarkable.  

At L4-L5, there is moderate spinal stenosis.  There is also mild disc bulging.  

At L5-S1, there is mild disc bulging and moderately severe spinal stenosis.
IMPRESSION: 1. Moderately severe L5-S1 spinal stenosis.  

2. Moderate L4-L5 spinal stenosis.

## 2022-11-14 ENCOUNTER — Other Ambulatory Visit: Payer: Self-pay

## 2022-11-14 ENCOUNTER — Other Ambulatory Visit: Payer: Medicare Other | Attending: INTERNAL MEDICINE

## 2022-11-14 DIAGNOSIS — I209 Angina pectoris, unspecified: Secondary | ICD-10-CM | POA: Insufficient documentation

## 2022-11-15 LAB — BUN: BUN: 18 mg/dL (ref 7–25)

## 2022-11-15 LAB — CREATININE WITH EGFR
CREATININE: 0.79 mg/dL (ref 0.60–1.30)
ESTIMATED GFR: 80 mL/min/{1.73_m2} (ref >59)

## 2022-12-12 NOTE — ED Notes (Signed)
Went over discharge instructions with patient, verbalizes understanding.  Denied having any further questions. Pain level is 8/10 , condition stable. Left ambulatory accompanied by self.  All belongings taken with patient.

## 2022-12-12 NOTE — ED Provider Notes (Signed)
ED Provider Note    Subjective     HPI  Jamie Chambers is a 72 year old female amatory to the ED with complaint of cough, nausea, sores in her mouth, weakness and upper midsternal chest discomfort.  The patient states she has had thrush in her mouth and has had a course of Diflucan and nystatin swish and swallow which have yielded no relief.  She states the symptoms started proximally 3 weeks ago.  She states that the pain is centered in her mid sternum and is a burning sensation.  She states at times she has had a white productive foamy cough.  Per the chart patient has a history of hypertension, for myalgia, TIAs, CKD, diabetes mellitus type 2, GERD, dyslipidemia, coronary disease, and diabetic neuropathy.  The patient also states that she has had a history of severe GERD in the past and esophagitis, and is scheduled to see her GI care provider this week for follow-up for that.  She states she is on Nexium and Pepcid daily.  She states she takes occasional Carafate for breakthrough GERD type pain and discomfort.   this patient was seen and examined using current PPE guidelines set by [facility], which may include gown, face covering or shield, protective eyewear, and mask N95/or surgical masking, with appropriate handwashing, precautions as standard measures.  Objective     Vitals <redacted file path>:  ED Triage Vitals [12/12/22 0712]   BP Pulse Resp Temp SpO2 Flow (L/min) (Oxygen Therapy)   124/67 85 20 98.5 F (36.9 C) 96 % --     Physical Exam      Nursing notes and vital signs reviewed.    Constitutional -NAD. Alert and Active.  HEENT - Normocephalic. Oropharynx with mild erythema, no obvious lesions, or exudates. Moist mucous membranes. PERRLA, EOMI. Conjunctiva clear.  Bilateral TMs, external canal, and external ears appear normal.  Neck - Trachea midline. No stridor. No hoarseness.  Cardiac - Regular rate and rhythm. No murmurs, rubs, or gallops.  Respiratory - Clear to auscultation bilaterally.  No rales, wheezes or rhonchi.  Abdomen - No abdominal tenderness to palpation, rebound or guarding noted.  Bowel sounds normal, non-distended.  Musculoskeletal - Good ROM throughout. No muscle or joint tenderness appreciated. No clubbing, cyanosis or edema.  Skin - Warm and dry, without any rashes or other lesions.  Neuro - Cranial nerves II-XII are grossly intact. Moving all extremities symmetrically. No focal neurologic deficits noted.  Psychiatric: Normal affect, alert and oriented x 3.    Any pertinent labs and imaging obtained during this encounter reviewed below in MDM.                   ---MDM, Assessment, and Plan---       INITIAL PLAN:   Physical examination, indicated diagnostic studies and treatments ..and review old records with disposition and treatment to be determined by results.        DATA REVIEW/RADIOLOGIC EXAMINATIONS:  Results for orders placed or performed during the hospital encounter of 12/12/22   SARS COV 2, INFLU. A/B, RSV PCR (COFRP)    Specimen: NASOPHARYNX   Result Value Ref Range    Specimen Nasal Swab     SARS-CoV-2 Not Detected Not Detected    Interpretative Comment       This test has been authorized by FDA under an Emergency Use  Authorization (EUA) for use by authorized laboratories. Please review the "Fact  Sheets" for health care providers, and patients and the  FDA authorized labeling  available on the [facility] website: www.[facility].com/Covid19.      Influenza A PCR Not Detected Not Detected    Influenza B PCR Not Detected Not Detected    Respiratory Syncytial Virus PCR Not Detected Not Detected    First test Not given     Employed In Healthcare No     Symptomatic as defined by CDC Not given     Date of symptom onset Not given     Hospitalized Not given     ICU Not given     Resident in a congregate care setting No     Is patient pregnant Not given     Race White or Caucasian     Ethnicity       Non  Hispanic  Lab studies performed by: [facility] located at [facility], [address]  [place], [place] [address]     STREP SCREEN RAPID REFLEX CULTURE (RAPCU)    Specimen: THROAT   Result Value Ref Range    Strep Screen Rapid Reflex Cult Neg Neg     Comment: This is a reflex culture order. Since screening test is negative, A beta  hemolytic Streptococcus culture will be performed.  Lab studies performed by: [facility] located at [facility], [address]  [place], [place] [address]     XR CHEST 1 VW    Narrative    EXAM:  XR Chest, 1 View.     CLINICAL HISTORY:  (prev. 02/20/21; non smoker; cough, SOB, concern for pneumonia )  Cough, short of breath, concern for pneumonia.    COMPARISON:  DX/SR - XR CHEST 1 VW - 02/20/2021 03:06 AM EDT   DX/SR - XR CHEST 1 VW - 02/17/2021 02:31 AM EDT     FINDINGS:    LUNGS AND PLEURA:  The lungs are clear.   No pleural effusion or pneumothorax.     HEART AND MEDIASTINUM:  The heart size and mediastinal contours are normal.     BONES:  No acute osseous abnormality.     IMPRESSION:    No acute cardiopulmonary pathology.    Electronically signed by: Olivia Canter, MD on 12/12/2022 08:09:28 AM US/Eastern   CBC WITH AUTO DIFF (CBCD)   Result Value Ref Range    WBC 7.4 4.0 - 10.5 K/uL    RBC 5.02 4.1 - 5.1 M/uL    Hemoglobin 14.4 12.0 - 16.0 g/dL    Hematocrit 09.8 36 - 46 %    MCV 87.8 78 - 98 fL    MCH 28.7 27.0 - 34.6 pg    MCHC 32.7 30.0 - 35.4 g/dL    RDW 11.9 14.7 - 82.9 %    Platelet Count 349 130 - 400 K/uL    MPV 9.5 9.4 - 12.4 fL    Immature Granulocyte 0.1 %    Seg 54.7 %    Lymph 35.6 %    Monos 7.2 %    Eos 2.0 %    Baso 0.4 %    Absolute Neut 4.0 1.8 - 7.7 K/uL    Absolute Lymph 2.6 1.0 - 5.0 K/uL    Absolute Mono 0.5 0 - 0.8 K/uL    Absolute Eos 0.2 0.0 - 0.4 K/uL    Absolute Basophils 0.0 0.0 - 0.2 K/uL    Absolute Immature Granulocyte 0.0 0.00 - 0.02 K/uL     Comment: Lab studies performed by: [facility] located at [facility], [address], [place] [address]     COMPREHENSIVE METABOLIC PANEL(COMP)   Result Value Ref Range    Sodium 138 136  - 148 MMOL/L    Potassium 3.5 3.5 - 5.2 MMOL/L    Chloride 99 98 - 108 MMOL/L    CO2 27 20 - 32 MMOL/L    Urea Nitrogen 12 7 - 23 MG/DL    Creatinine 6.04 5.40 - 1.02 MG/DL     Comment: Creatinine results have been standardized to a reference material that is  traceable to IDMS method.      Glucose, Bld 125 (H) 74 - 106 mg/dL     Comment: Non fasting glucose range:  65-140 mg/dl  Impaired fasting Glucose:  100-125 mg/dL      Total Protein 6.9 5.7 - 8.2 G/DL    Albumin 3.4 3.2 - 5.0 G/DL    Calcium 9.1 8.5 - 98.1 MG/DL    Total Bilirubin 0.3 0.2 - 1.1 MG/DL    Alkaline Phosphatase, Serum 60 46 - 116 U/L    AST 9 (L) 15 - 37 U/L    ALT 20 14 - 59 U/L    Globulin 3.5 1.7 - 3.9 G/DL    Albumin/Globulin 1.0 0.7 - 2.3 RATIO    Anion Gap 12 3 - 12 mmol/L    Osmolality Calc 287 278 - 305 MOS/KG     Comment: Calculated Osmolality formula has been changed to the Smithline-Gardner  Formula: [2(NA)+GLU/18+BUN/2.8]. Please note change in value from the previous  calculated results.      Bun/Creatinine 13.3 7 - 20    Glom Filt Rate, Estimated 67 >60 ml/min/1.27m2     Comment: (Note)  The eGFR is based on the CKD-EPI 2021 equation.  To calculate the new  eGFR from a previous Creatinine or Cystatin C result, go to  https://www.kidney.org/professionals/kdoqi/gfr%5Fcalculator  Lab studies performed by: [facility] located at [facility], [address] [place], [place] [address]     MAGNESIUM(MG)   Result Value Ref Range    Magnesium 1.6 1.6 - 2.6 MG/DL     Comment: Lab studies performed by: [facility] located at [facility], [address]  [place], [place] [address]     TROPONIN I HIGH SENSITIVITY (TROPHS)   Result Value Ref Range    Troponin HS 5 <37 ng/L     Comment: Lab studies performed by: [facility] located at [facility], [address]  [place], [place] [address]     PROTIME-INR(PT)   Result Value Ref Range    Pt(Patient) 10.0 9.0 - 11.5 SEC     Comment: Reference interval is for nonmedicated patients.    INR 1.0 Ratio     Comment:  (Note)  Reference range                       0.9-1.1  Moderate-intensity warfarin therapy   2.0-3.0  High-intensity warfarin therapy       2.5-3.5  Lab studies performed by: [facility] located at [facility], [employer] [place], [place] [address]     APTT(PTT)   Result Value Ref Range    PTT 26.0 22 - 34 SEC     Comment: Lab studies performed by: [facility] located at [facility], [employer] [place], [place] [address]     *CULTURE STATUS [facility]   Result Value Ref Range    Culture Status       This specimen meets the criteria for culture. Please see separate  culture  report.  Lab studies performed by: [facility] located at [facility], [address]  [place], [place] [address]               EKG PER PROVIDER:  8:03 AM: Sinus rhythm with occasional PVCs, rate equals 73 bpm.    ED COURSE, DIFFERENTIAL DIAGNOSIS AND MEDICAL DECISION MAKING:  7:21 AM: Patient seen and examined.  Basic lab work, troponin EKG, chest x-ray ordered.  Symptomatic relief Protonix GI cocktail ordered.  1 aspirin 325 mg ordered.  8:58 AM: Results of workup discussed with patient, chest x-ray EKG and lab work essentially within normal limits.  COVID flu RSV swabs negative.  Recommend follow-up with GI care provider this week as scheduled for likely symptoms consistent with erosive esophagitis.  Will add Carafate and Mylanta to patient's regimen of Nexium and Pepcid, patient verbalized agreement understanding will follow-up with her GI care provider as described. Red flags for return were discussed with the patient, and the patient verbalized agreement understanding with the plan.  Patient agreed to see their primary care provider in 1 to 2 days for recheck, the patient was stable for discharge.                      Clinical Impression <redacted file path>:  1. Generalized weakness    2. Cough, unspecified type    3. Gastroesophageal reflux disease with esophagitis, unspecified whether hemorrhage    4. Chest discomfort       Condition: Stable and  Improved  Disposition <redacted file path>:  Discharge

## 2023-01-25 ENCOUNTER — Encounter (INDEPENDENT_AMBULATORY_CARE_PROVIDER_SITE_OTHER): Payer: Self-pay | Admitting: OTOLARYNGOLOGY

## 2023-02-15 IMAGING — US US RETROPERITONEAL COMPLETE
1 series · 14 of 25 positions shown · non-contrast
Comparison: CT abdomen and pelvis dated 04/18/2012.

﻿EXAM:  71774   US RETROPERITONEAL COMPLETE
INDICATION: Abnormal renal function.
TECHNIQUE: Multiple views of kidneys and bladder were obtained as per protocol.

[Series 1: us retroperitoneal complete · 14 of 70 slices shown]
[im 1/70]
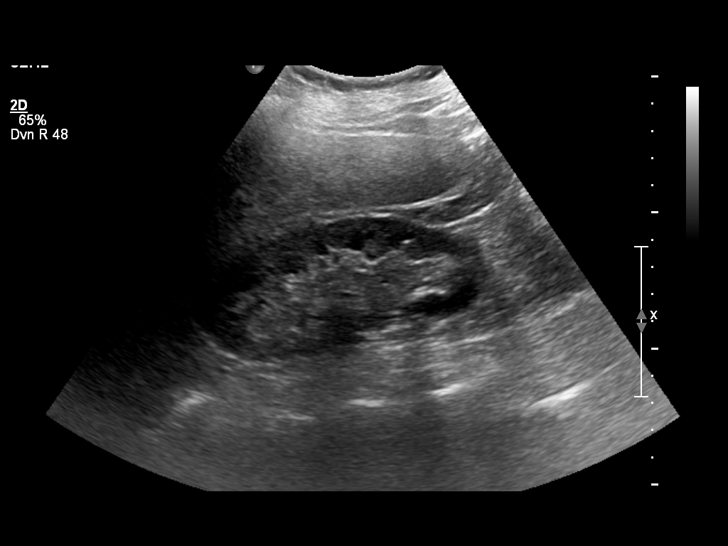
[im 6/70]
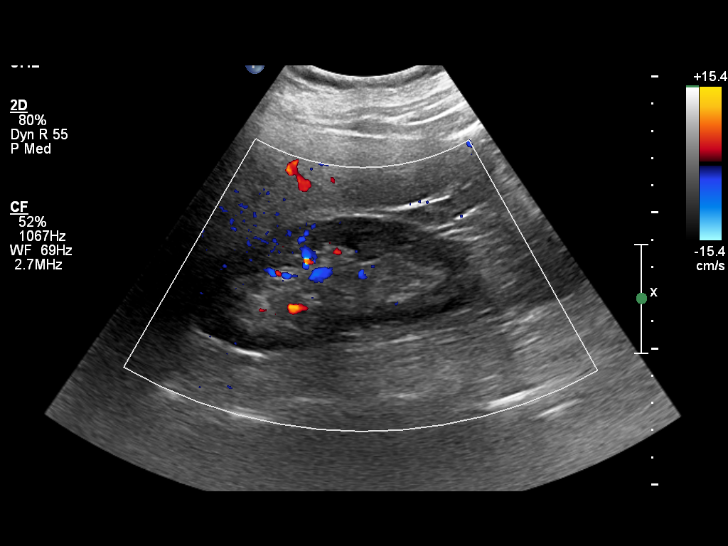
[im 12/70]
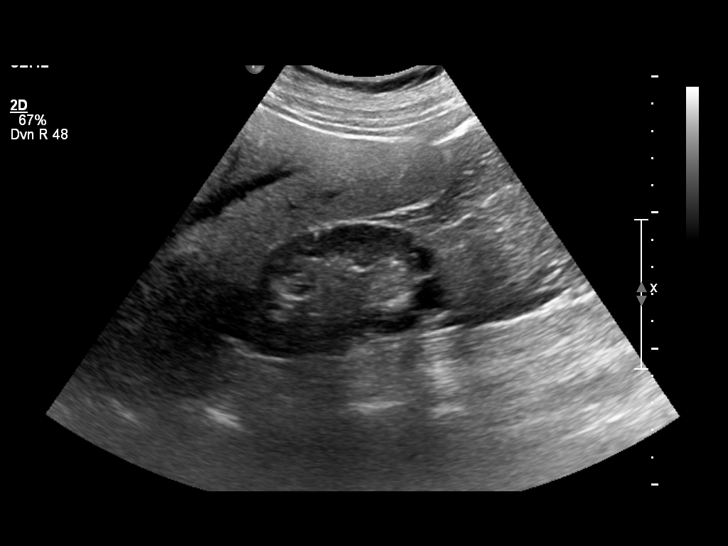
[im 18/70]
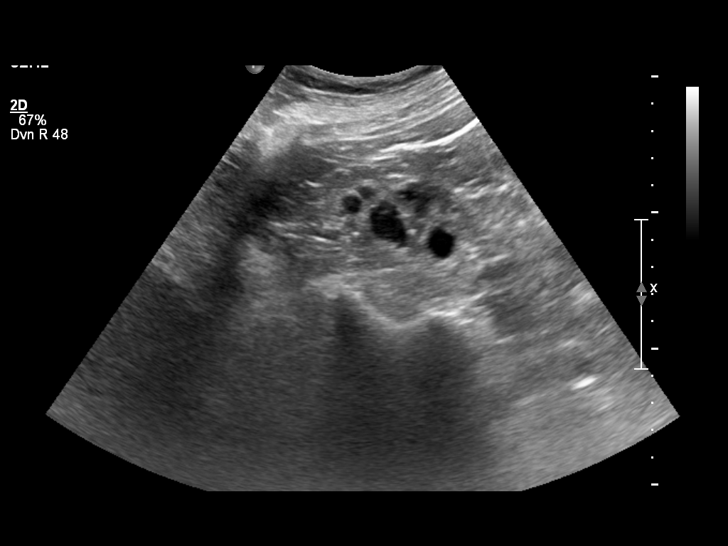
[im 24/70]
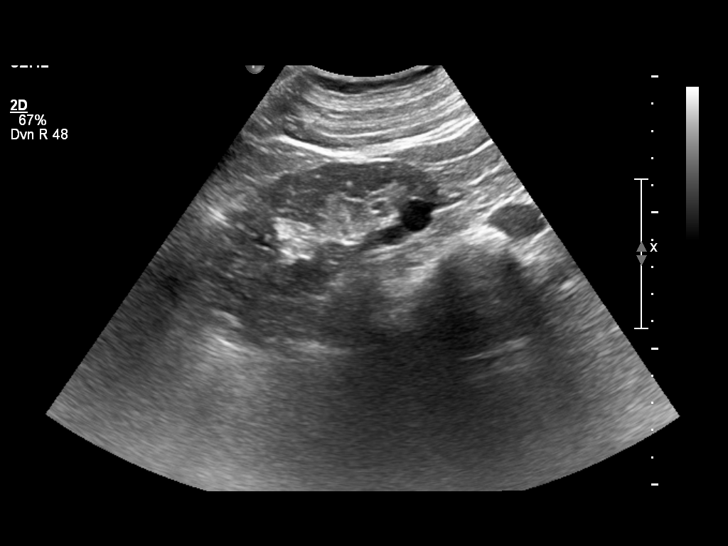
[im 26/70]
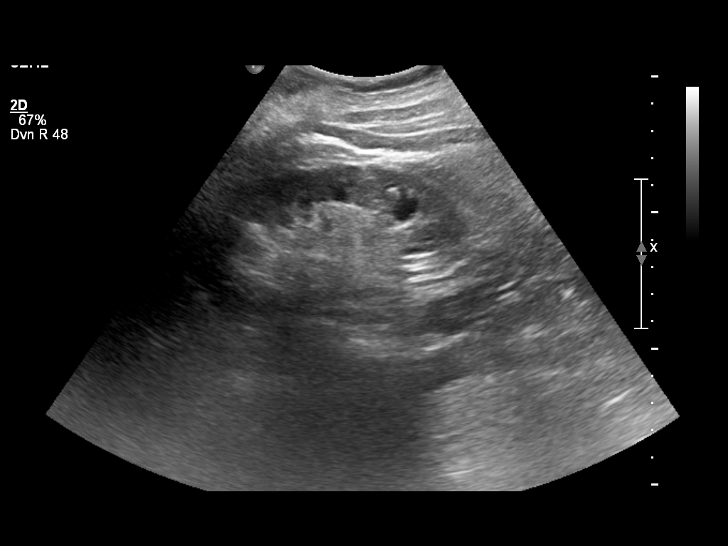
[im 32/70]
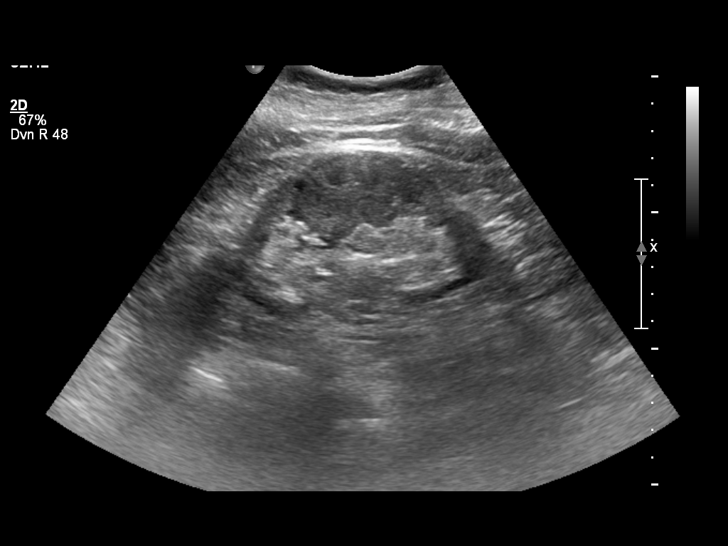
[im 38/70]
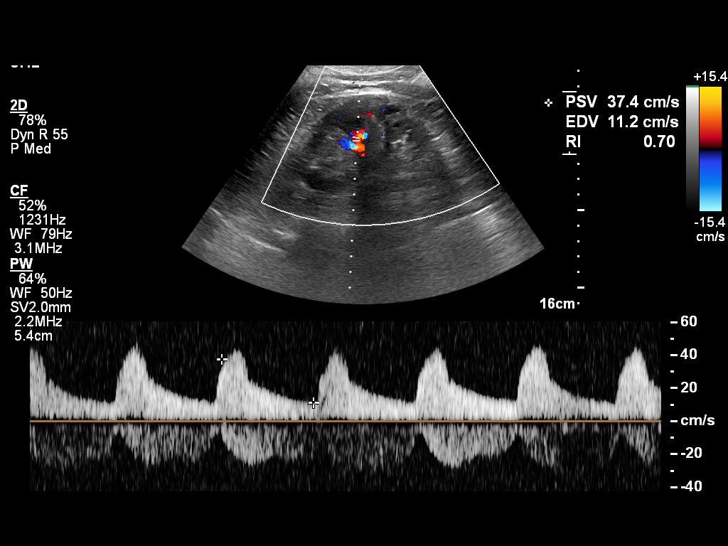
[im 44/70]
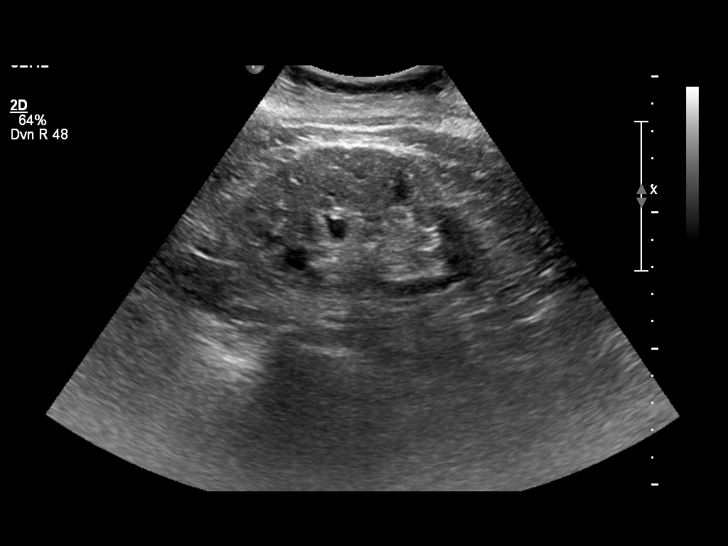
[im 47/70]
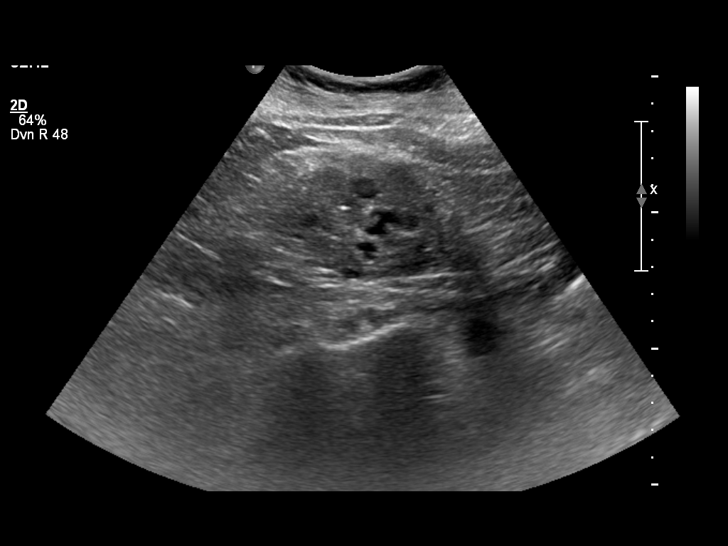
[im 52/70]
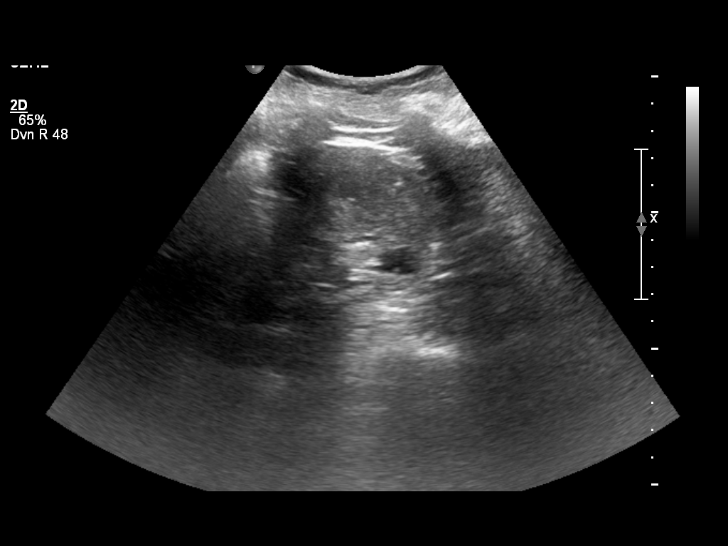
[im 58/70]
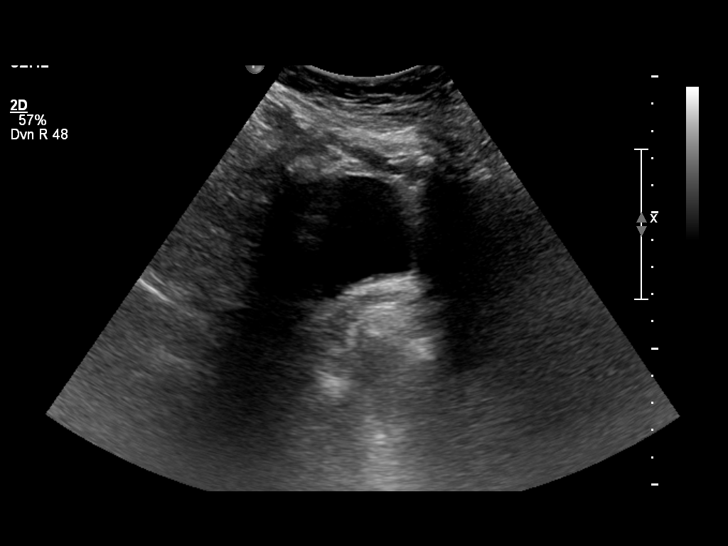
[im 64/70]
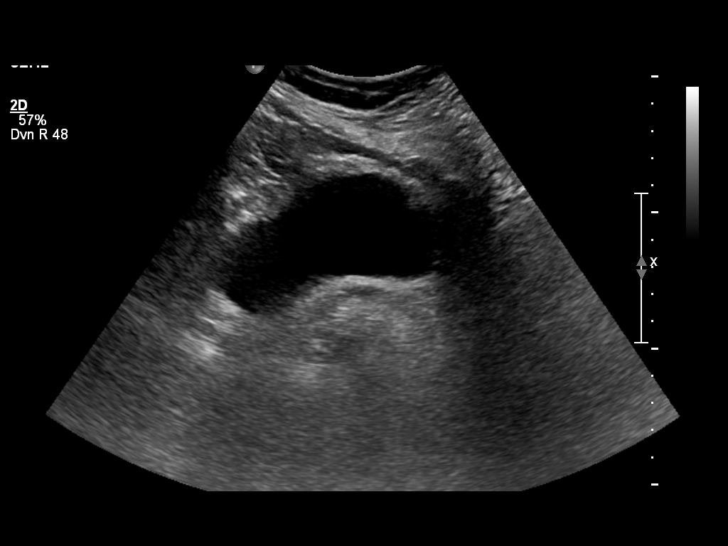
[im 70/70]
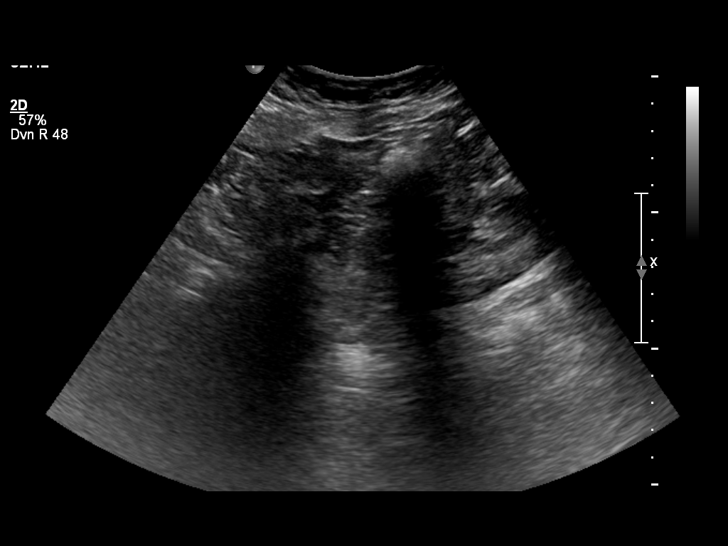

[14 of 25 positions shown; findings below may reference images not displayed]

FINDINGS: Kidneys are normal in size measuring a length of 11 cm on the right and 10 cm on the left.  No obstructive changes of the kidneys are seen. Increased echotexture of renal cortex is noted on both sides indicating medical renal changes. Small cystic lesions are noted on both sides measuring up to 11 mm in diameter.  No large calculi are seen.

Urinary bladder is not optimally distended. No free fluid in the pelvis.
IMPRESSION: 1. Normal size kidneys. Significantly increased echotexture of renal cortex due to medical renal changes.  Small cystic lesions of both kidneys measuring up to 11 mm in diameter.

2. Incompletely distended bladder.  No definite focal lesions of the bladder are seen.

## 2023-03-07 ENCOUNTER — Ambulatory Visit (INDEPENDENT_AMBULATORY_CARE_PROVIDER_SITE_OTHER): Payer: Medicare Other | Admitting: OTOLARYNGOLOGY

## 2023-03-07 ENCOUNTER — Other Ambulatory Visit: Payer: Medicare Other | Attending: OTOLARYNGOLOGY

## 2023-03-07 ENCOUNTER — Other Ambulatory Visit: Payer: Self-pay

## 2023-03-07 ENCOUNTER — Encounter (INDEPENDENT_AMBULATORY_CARE_PROVIDER_SITE_OTHER): Payer: Self-pay | Admitting: OTOLARYNGOLOGY

## 2023-03-07 VITALS — Ht 60.0 in | Wt 142.0 lb

## 2023-03-07 DIAGNOSIS — N281 Cyst of kidney, acquired: Secondary | ICD-10-CM | POA: Insufficient documentation

## 2023-03-07 DIAGNOSIS — H9209 Otalgia, unspecified ear: Secondary | ICD-10-CM

## 2023-03-07 DIAGNOSIS — K146 Glossodynia: Secondary | ICD-10-CM

## 2023-03-07 DIAGNOSIS — M5136 Other intervertebral disc degeneration, lumbar region: Secondary | ICD-10-CM | POA: Insufficient documentation

## 2023-03-07 DIAGNOSIS — E43 Unspecified severe protein-calorie malnutrition: Secondary | ICD-10-CM

## 2023-03-07 DIAGNOSIS — R198 Other specified symptoms and signs involving the digestive system and abdomen: Secondary | ICD-10-CM | POA: Insufficient documentation

## 2023-03-07 DIAGNOSIS — E1129 Type 2 diabetes mellitus with other diabetic kidney complication: Secondary | ICD-10-CM | POA: Insufficient documentation

## 2023-03-07 DIAGNOSIS — M1990 Unspecified osteoarthritis, unspecified site: Secondary | ICD-10-CM | POA: Insufficient documentation

## 2023-03-07 DIAGNOSIS — K219 Gastro-esophageal reflux disease without esophagitis: Secondary | ICD-10-CM

## 2023-03-07 DIAGNOSIS — H9193 Unspecified hearing loss, bilateral: Secondary | ICD-10-CM

## 2023-03-07 DIAGNOSIS — G4700 Insomnia, unspecified: Secondary | ICD-10-CM | POA: Insufficient documentation

## 2023-03-07 DIAGNOSIS — I10 Essential (primary) hypertension: Secondary | ICD-10-CM | POA: Insufficient documentation

## 2023-03-07 DIAGNOSIS — R200 Anesthesia of skin: Secondary | ICD-10-CM | POA: Insufficient documentation

## 2023-03-07 DIAGNOSIS — M797 Fibromyalgia: Secondary | ICD-10-CM | POA: Insufficient documentation

## 2023-03-07 DIAGNOSIS — E785 Hyperlipidemia, unspecified: Secondary | ICD-10-CM | POA: Insufficient documentation

## 2023-03-07 DIAGNOSIS — H9190 Unspecified hearing loss, unspecified ear: Secondary | ICD-10-CM

## 2023-03-07 LAB — VITAMIN D 25 TOTAL: VITAMIN D: 71 ng/mL (ref 30–100)

## 2023-03-07 LAB — VITAMIN B12: VITAMIN B 12: 234 pg/mL (ref 180–914)

## 2023-03-07 LAB — GLUCOSE FASTING: GLUCOSE FASTING: 107 mg/dL (ref 74–109)

## 2023-03-07 LAB — THYROID STIMULATING HORMONE (SENSITIVE TSH): TSH: 1.887 u[IU]/mL (ref 0.450–5.330)

## 2023-03-07 NOTE — H&P (Signed)
ENT, [facility]  [address]  [place] [address]        Name: Jamie Chambers MRN:  [id number]   Date: 03/07/2023 DOB: 1950/09/07 (72 y.o.)       Referring Provider:  No ref. provider found    Reason for Visit:   Chief Complaint   Patient presents with    Ear Problem(s)     Last office visit 11/2020  States having ear wax causing hearing loss. Stated her PCP did clean her ears before her ENT appt.         History of Present Illness:  Jamie Chambers is a 72 y.o. female who is FU on ears, last seen 11/2020. Pt states she called our office for this appt due to ear clogging and fullness. Her PCP cleaned her ears with immeidate improvement in her hearing. She c/o tinnitus like a drum in the left ear x few weeks. She c/o AS>AD hearing loss. Denies otorrhea.   She also c/o burning mouth and tongue x 2 months. Seeing GI in [place] for frequent GERD. Had an EGD 2 weeks ago.       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)     Current Outpatient Medications   Medication Sig    albuterol sulfate (PROVENTIL OR VENTOLIN OR PROAIR) 90 mcg/actuation Inhalation oral inhaler Take 1 Puff by inhalation    amLODIPine (NORVASC) 5 mg Oral Tablet Take 1 Tablet (5 mg total) by mouth    aspirin 81 mg Oral Tablet, Chewable Chew 1 Tablet (81 mg total)    docusate sodium (COLACE) 100 mg Oral Capsule Take 2 Capsules (200 mg total) by mouth    esomeprazole magnesium (NEXIUM) 40 mg Oral Capsule, Delayed Release(E.C.) Take 1 Capsule (40 mg total) by mouth    famotidine (PEPCID) 40 mg Oral Tablet Take 1 Tablet (40 mg total) by mouth    fenofibric acid (TRILIPIX) 135 mg Oral  Capsule, Delayed Release(E.C.)     JARDIANCE 25 mg Oral Tablet     losartan-hydrochlorothiazide (HYZAAR) 100-12.5 mg Oral Tablet     metFORMIN (GLUCOPHAGE XR) 500 mg Oral Tablet Sustained Release 24 hr Take 2 Tablets (1,000 mg total) by mouth    metoprolol succinate (TOPROL-XL) 50 mg Oral Tablet Sustained Release 24 hr Take 1 Tablet (50 mg total) by mouth    pravastatin (PRAVACHOL) 40 mg Oral Tablet Take 1 Tablet (40 mg total) by mouth    sucralfate (CARAFATE) 100 mg/mL Oral Suspension Take 10 mL (1 g total) by mouth      Allergies   Allergen Reactions    Ace Inhibitors Anaphylaxis and Swelling    Latex Angioedema    Prochlorperazine Nausea/ Vomiting and Rash     History reviewed. No pertinent past medical history.  History reviewed. No pertinent surgical history.  Family Medical History:    None         Social History     Tobacco Use    Smoking status: Never     Passive exposure: Never  Smokeless tobacco: Never   Substance Use Topics    Alcohol use: Never    Drug use: Never       Review of Systems:  Review of Systems    Physical Exam:  Ht 1.524 m (5')   Wt 64.4 kg (142 lb)   BMI 27.73 kg/m       Physical Exam  Constitutional:       Appearance: Normal appearance. She is well-developed, well-groomed and normal weight.   HENT:      Head: Normocephalic and atraumatic.      Right Ear: Hearing, tympanic membrane, ear canal and external ear normal.      Left Ear: Hearing, tympanic membrane, ear canal and external ear normal.      Nose: Septal deviation and mucosal edema present.      Right Turbinates: Enlarged.      Left Turbinates: Enlarged.      Mouth/Throat:      Lips: Pink.      Mouth: Mucous membranes are moist.      Pharynx: Oropharynx is clear. Uvula midline.   Eyes:      Extraocular Movements: Extraocular movements intact.   Neck:      Trachea: Phonation normal.   Pulmonary:      Effort: Pulmonary effort is normal.   Musculoskeletal:      Cervical back: Normal range of motion and neck supple.    Lymphadenopathy:      Cervical: No cervical adenopathy.   Skin:     General: Skin is warm.   Neurological:      Mental Status: She is alert and oriented to person, place, and time.      Cranial Nerves: Cranial nerves 2-12 are intact. No facial asymmetry.   Psychiatric:         Attention and Perception: Attention normal.         Mood and Affect: Mood normal.         Speech: Speech normal.         Behavior: Behavior normal. Behavior is cooperative.          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 Hearing loss, unspecified hearing loss type, unspecified laterality  H91.90   3. Otalgia, unspecified laterality  H92.09   4. 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 Unspecified severe protein-calorie malnutrition (CMS HCC)  E43   6. Numbness of tongue  R20.0   7. Tongue symptom  R19.8       Plan:  Medical records reviewed on 03/07/2023.  Will get audiogram.   Will get labs for burning mouth.   Diet modifications/reflux precautions  Continue Carafate, Nexium and Pepcid  Orders Placed This Encounter    [address]75 - LARYNGOSCOPY, FLEXIBLE DIAGNOSTIC (AMB ONLY)    GLUCOSE FASTING    VITAMIN B6 (PYRIDOXAL 5-PHOSPHATE), PLASMA    ZINC, SERUM    THYROID STIMULATING HORMONE (SENSITIVE TSH)    VITAMIN D 25 TOTAL    VITAMIN B12    AMB PRN REFERRAL EXTERNAL AUDIOLOGIST     Follow-up after audiogram/labs or sooner PRN    The advanced practice clinician's documentation was reviewed/amended in its entirety with the assessment and plan portion completely performed independently by me during this encounter.   Lonia Farber, D.O., MMS    Marcelline Deist, PA-C  ENT / Facial Plastic Surgery    I appreciate the opportunity to be involved in the  care of your patients.  If you have any questions or concerns regarding this encounter, please do not hesitate to contact me at your convenience.        This note may have been partially generated using MModal Fluency Direct system, and there may be some  incorrect words, spellings, and punctuation that were not noted in checking the note before saving, though effort was made to avoid such errors.

## 2023-03-10 LAB — ZINC, SERUM: ZINC: 66 ug/dL (ref 60–130)

## 2023-03-11 LAB — VITAMIN B6 (PYRIDOXAL 5-PHOSPHATE), PLASMA: VITAMIN B6, PLASMA: 10.6 ng/mL (ref 2.1–21.7)

## 2023-03-13 ENCOUNTER — Telehealth (INDEPENDENT_AMBULATORY_CARE_PROVIDER_SITE_OTHER): Payer: Self-pay | Admitting: OTOLARYNGOLOGY

## 2023-03-13 NOTE — Telephone Encounter (Signed)
Left VM for patient for [facility] appt for 04/24/23 @ 9AM.     lnt

## 2023-03-29 NOTE — Procedures (Signed)
[facility], [facility]  [address]  [place] [address]    Procedure Note    Name: Jamie Chambers MRN:  [id number]   Date: 03/07/2023 DOB:  August 11, 1951 (71 y.o.)         [address] - LARYNGOSCOPY, FLEXIBLE DIAGNOSTIC (AMB ONLY)    Performed by: Lonia Farber, DO  Authorized by: Lonia Farber, DO    Time Out:     Immediately before the procedure, a time out was called:  Yes    Patient verified:  Yes    Procedure Verified:  Yes    Site Verified:  Yes  Indications for procedure: Unable to fully visualize laryngeal anatomy on indirect laryngoscopy and GERD / LPR management    Anesthesia:  Topical lidocaine/ phenylephrine     Description: The flexible endoscope was gently introduced into the nostril and passed along the floor of the nose to the nasopharynx. Adenoid pad and Eustachian tube orifices. The retropalatal airway was patent.    The endoscope was passed to the oropharynx.  Base of tongue/lingual tonsils without masses or lesions, patent valelulla, and sharply defined upright epiglottis. Retrolingual airway was patent.    The larynx displayed normal true vocal cords with good mobility. False cords were normal. Arytenoid mucosa was mildly erythematous and edematous.    The piriform recesses were symmetric without secretion. The hypopharynx was symmetric without lesion.    Findings: Laryngopharyngeal Reflux    The patient tolerated the procedure well    This note may have been partially generated using MModal Fluency Direct system, and there may be some incorrect words, spellings, and punctuation that were not noted in checking the note before saving, though effort was made to avoid such errors.      Lonia Farber, DO

## 2023-04-03 IMAGING — MR MRI HIP LT W/O CONTRAST
5 of 7 series · 24 of 40 positions shown · IV contrast (gadolinium)
Comparison: Radiographs from outside dated 05/30/2022..

﻿EXAM:  41419   MRI HIP LT W/O CONTRAST
INDICATION: Left hip pain for about 1 year. Diagnosis of trochanteric bursitis.  No history of hip surgery.  No history of malignancy..
TECHNIQUE: Multiplanar multisequential MRI of the pelvis and left hip was performed without gadolinium contrast.

[Series 7: T2 fat-sat · axial · 4.5mm · 0.94mm/px · z∈[-109,+36]mm · 6 of 30 slices shown (1 of 3)]
[im 1/30]
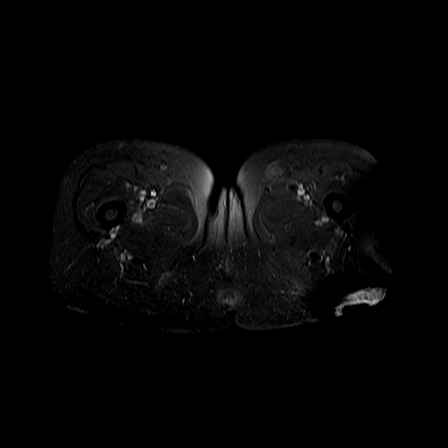
[im 6/30]
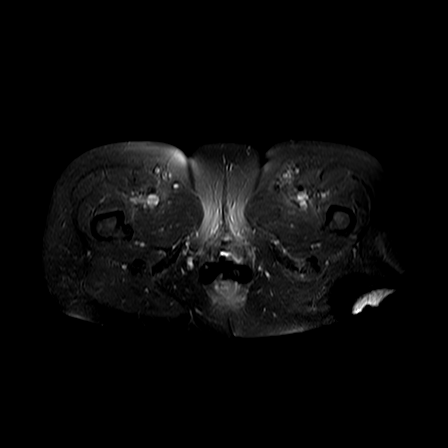
[im 12/30]
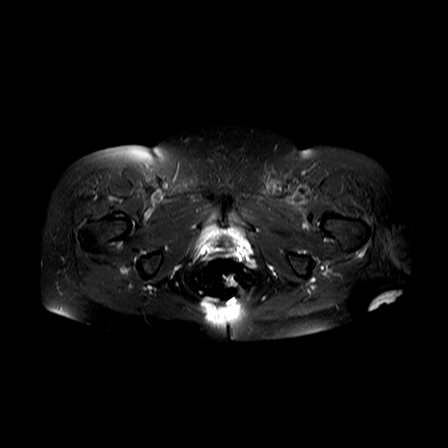
[im 18/30]
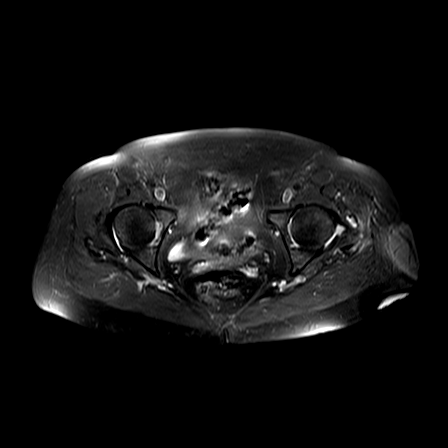
[im 24/30]
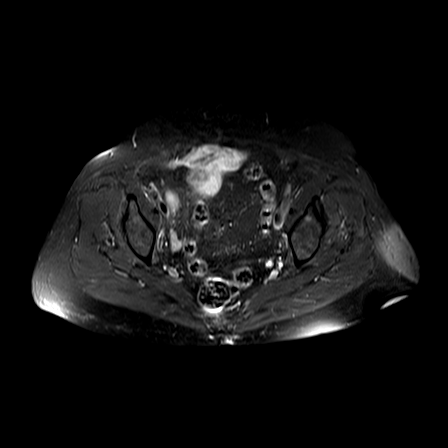
[im 30/30]
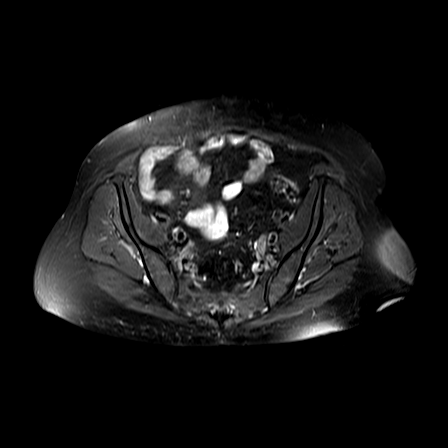

[Series 8: T1 · axial · 4.5mm · 0.82mm/px · z∈[-109,+36]mm · 7 of 30 slices shown (1 of 2)]
[im 1/30]
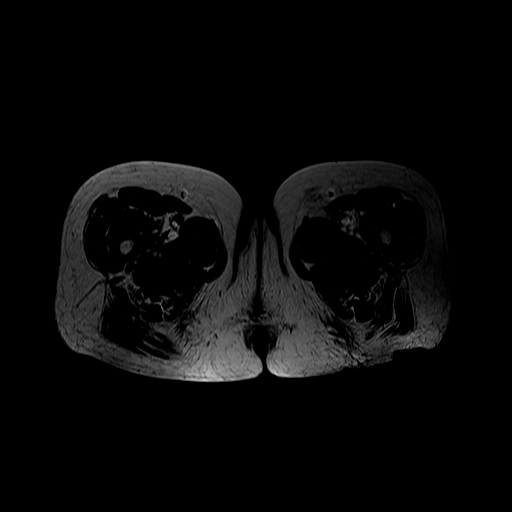
[im 5/30]
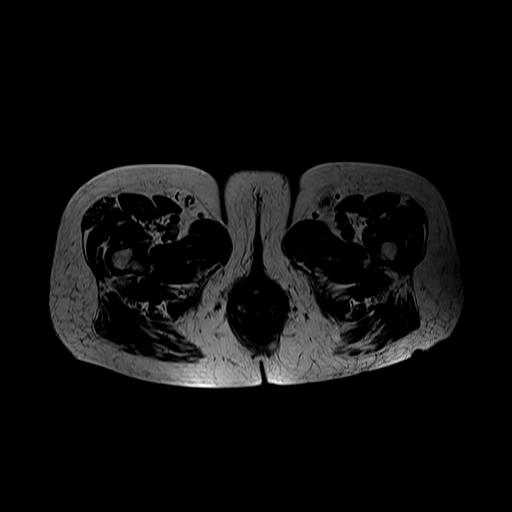
[im 10/30]
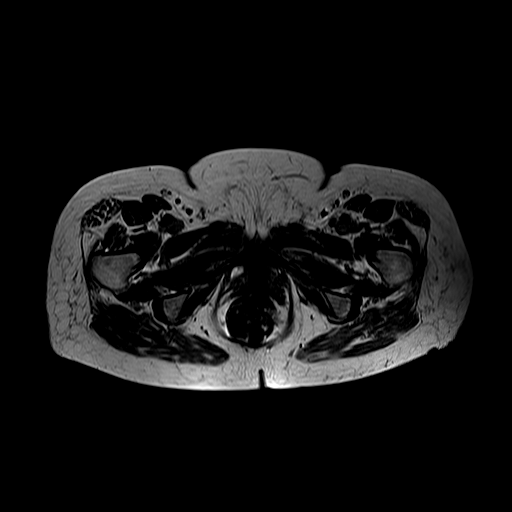
[im 15/30]
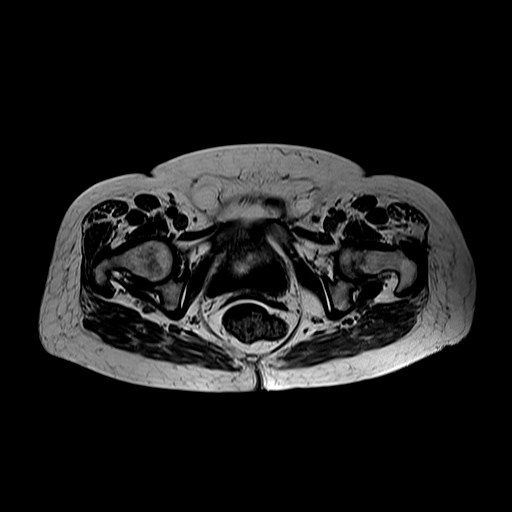
[im 20/30]
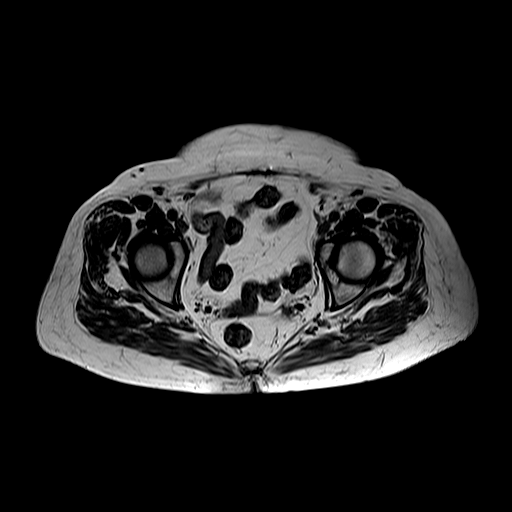
[im 25/30]
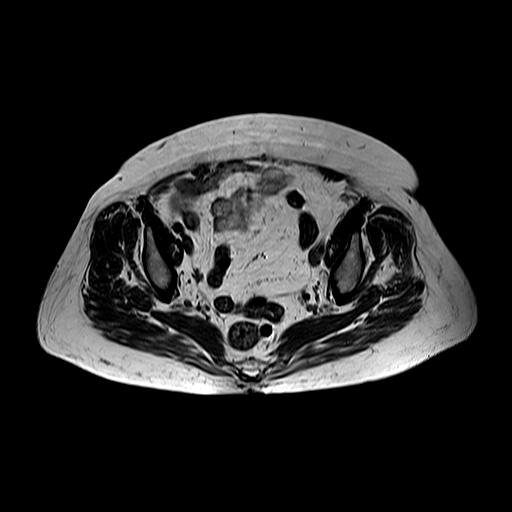
[im 30/30]
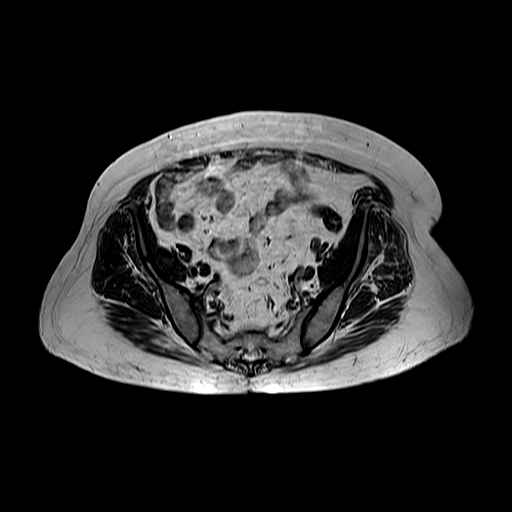

[Series 10: T1 · coronal · 4.0mm · 0.82mm/px · 1 of 20 slices shown (2 of 2)]
[im 1/20]
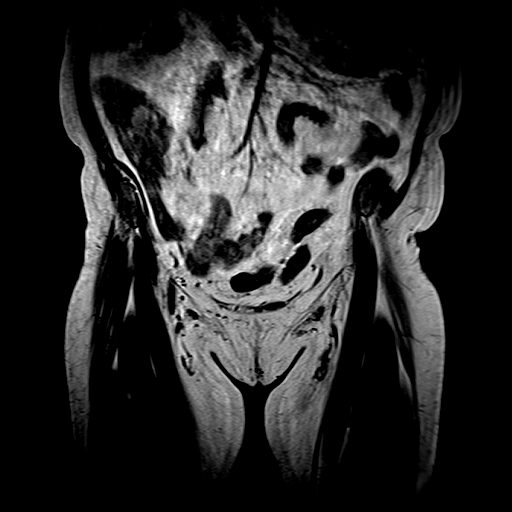

[Series 11: T2 fat-sat · coronal · 4.0mm · 0.82mm/px · 5 of 20 slices shown (2 of 3)]
[im 1/20]
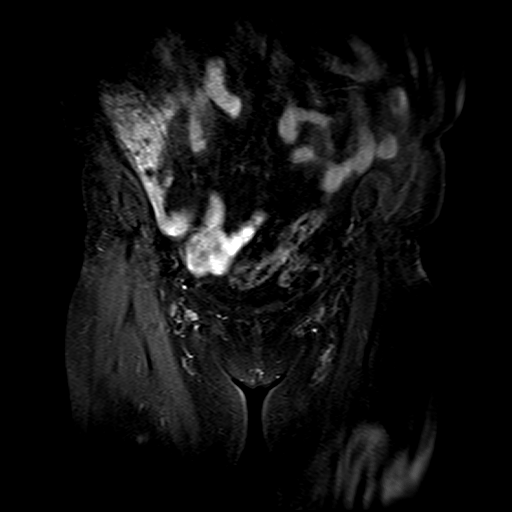
[im 5/20]
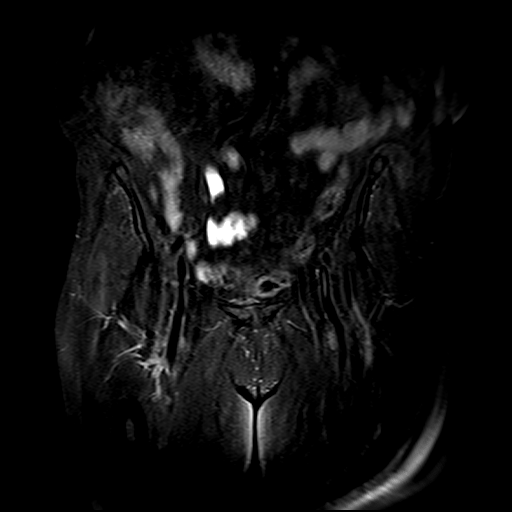
[im 10/20]
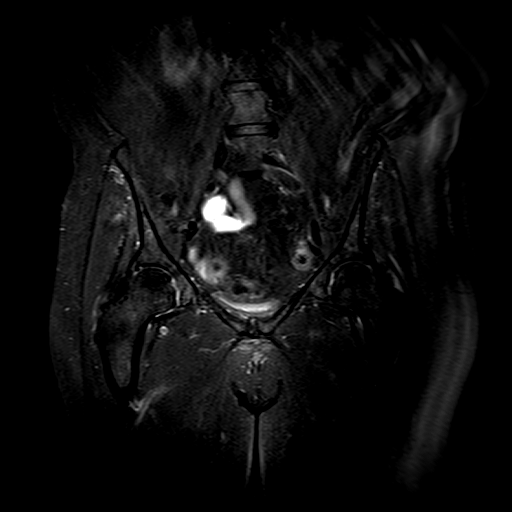
[im 15/20]
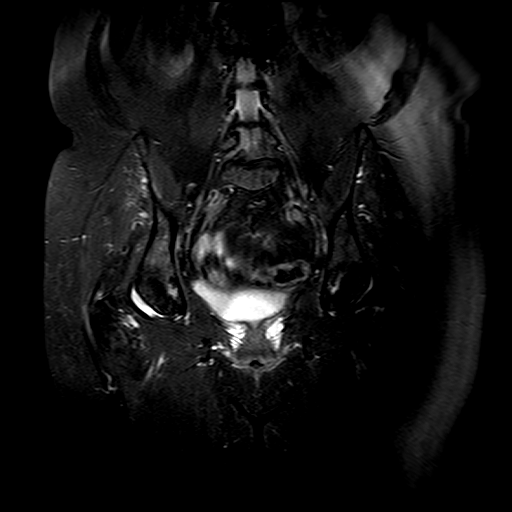
[im 20/20]
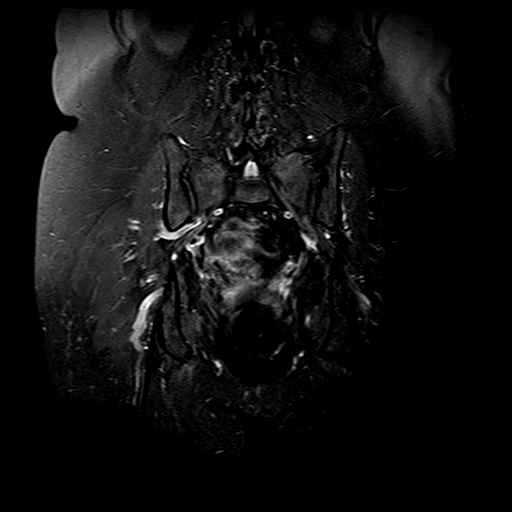

[Series 13: T2 fat-sat · sagittal · 4.5mm · 0.78mm/px · 5 of 20 slices shown (3 of 3)]
[im 1/20]
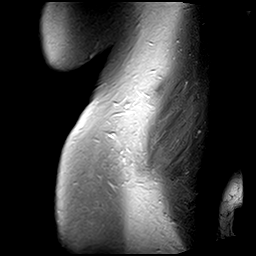
[im 5/20]
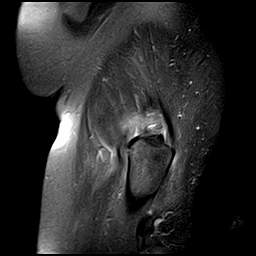
[im 10/20]
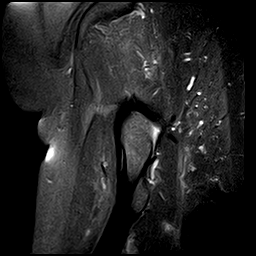
[im 15/20]
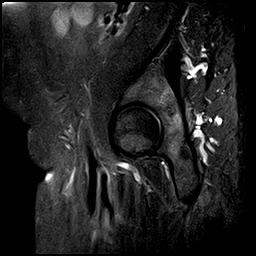
[im 20/20]
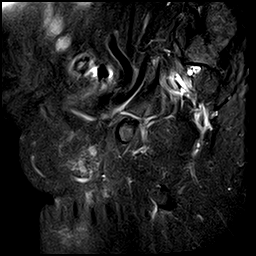

[24 of 40 positions shown; findings below may reference images not displayed]

FINDINGS: No acute bony lesions of the pelvis and both hips including the left hip.  No MRI evidence of avascular necrosis.  Mild osteoarthritis of both hips are noted. No evidence of acetabular labral tear.

Mild inflammatory changes over the greater trochanter suggestive of trochanteric bursitis with tendinitis at the greater trochanter.  Small 4-5 mm size bursa is noted at the trochanter.

Remaining soft tissues are unremarkable.  No evidence of effusion.
IMPRESSION: 1. No acute bony lesions.  No MRI evidence of AVN.

2. Mild osteoarthritis of both hips.  No evidence of labral tear.  No effusion. 

3. Mild bursitis and tendinitis at the greater trochanter of the left hip.  Small, 4-5 mm size bursa is noted just above the greater trochanter.

## 2023-04-26 ENCOUNTER — Encounter (INDEPENDENT_AMBULATORY_CARE_PROVIDER_SITE_OTHER): Payer: Self-pay | Admitting: OTOLARYNGOLOGY

## 2024-07-03 ENCOUNTER — Ambulatory Visit (INDEPENDENT_AMBULATORY_CARE_PROVIDER_SITE_OTHER): Payer: Self-pay | Admitting: OTOLARYNGOLOGY

## 2024-12-04 ENCOUNTER — Ambulatory Visit (INDEPENDENT_AMBULATORY_CARE_PROVIDER_SITE_OTHER): Admitting: OTOLARYNGOLOGY
